# Patient Record
Sex: Male | Born: 1950 | Race: White | Hispanic: No | Marital: Single | Smoking: Never smoker
Health system: Southern US, Community
[De-identification: ages and names within clinical notes are randomized; demographics above are authoritative.]

## PROBLEM LIST (undated history)

## (undated) DIAGNOSIS — I10 Essential (primary) hypertension: Secondary | ICD-10-CM

## (undated) DIAGNOSIS — E785 Hyperlipidemia, unspecified: Secondary | ICD-10-CM

## (undated) DIAGNOSIS — M1990 Unspecified osteoarthritis, unspecified site: Secondary | ICD-10-CM

## (undated) DIAGNOSIS — I493 Ventricular premature depolarization: Secondary | ICD-10-CM

## (undated) HISTORY — PX: ORIF ELBOW FRACTURE: SUR928

---

## 1998-04-14 ENCOUNTER — Encounter: Payer: Self-pay | Admitting: Emergency Medicine

## 1998-04-14 ENCOUNTER — Emergency Department (HOSPITAL_COMMUNITY): Admission: EM | Admit: 1998-04-14 | Discharge: 1998-04-14 | Payer: Self-pay | Admitting: Emergency Medicine

## 2004-08-30 ENCOUNTER — Inpatient Hospital Stay (HOSPITAL_COMMUNITY): Admission: EM | Admit: 2004-08-30 | Discharge: 2004-09-01 | Payer: Self-pay | Admitting: Emergency Medicine

## 2004-09-04 ENCOUNTER — Inpatient Hospital Stay (HOSPITAL_COMMUNITY): Admission: AD | Admit: 2004-09-04 | Discharge: 2004-09-07 | Payer: Self-pay | Admitting: Orthopedic Surgery

## 2004-09-08 ENCOUNTER — Ambulatory Visit: Admission: RE | Admit: 2004-09-08 | Discharge: 2004-09-12 | Payer: Self-pay | Admitting: Radiation Oncology

## 2004-09-15 ENCOUNTER — Encounter: Admission: RE | Admit: 2004-09-15 | Discharge: 2004-12-14 | Payer: Self-pay | Admitting: Orthopedic Surgery

## 2004-10-14 ENCOUNTER — Ambulatory Visit: Admission: RE | Admit: 2004-10-14 | Discharge: 2004-10-14 | Payer: Self-pay | Admitting: Radiation Oncology

## 2004-12-15 ENCOUNTER — Encounter: Admission: RE | Admit: 2004-12-15 | Discharge: 2005-03-15 | Payer: Self-pay | Admitting: Orthopedic Surgery

## 2005-03-17 ENCOUNTER — Encounter: Admission: RE | Admit: 2005-03-17 | Discharge: 2005-06-15 | Payer: Self-pay | Admitting: Orthopedic Surgery

## 2005-06-16 ENCOUNTER — Encounter: Admission: RE | Admit: 2005-06-16 | Discharge: 2005-09-14 | Payer: Self-pay | Admitting: Orthopedic Surgery

## 2008-08-27 ENCOUNTER — Ambulatory Visit: Payer: Self-pay | Admitting: Gastroenterology

## 2016-02-05 ENCOUNTER — Encounter: Payer: Self-pay | Admitting: *Deleted

## 2016-02-06 ENCOUNTER — Encounter: Payer: Self-pay | Admitting: *Deleted

## 2016-02-06 ENCOUNTER — Ambulatory Visit
Admission: RE | Admit: 2016-02-06 | Discharge: 2016-02-06 | Disposition: A | Discharge status: Home / Self Care | Payer: Medicare Other | Source: Ambulatory Visit | Attending: Gastroenterology | Admitting: Gastroenterology

## 2016-02-06 ENCOUNTER — Encounter
Admission: RE | Disposition: A | Discharge status: Home / Self Care | Payer: Self-pay | Source: Ambulatory Visit | Attending: Gastroenterology

## 2016-02-06 ENCOUNTER — Ambulatory Visit: Payer: Medicare Other | Admitting: Anesthesiology

## 2016-02-06 DIAGNOSIS — Z79899 Other long term (current) drug therapy: Secondary | ICD-10-CM | POA: Diagnosis not present

## 2016-02-06 DIAGNOSIS — K621 Rectal polyp: Secondary | ICD-10-CM | POA: Diagnosis not present

## 2016-02-06 DIAGNOSIS — K640 First degree hemorrhoids: Secondary | ICD-10-CM | POA: Insufficient documentation

## 2016-02-06 DIAGNOSIS — D120 Benign neoplasm of cecum: Secondary | ICD-10-CM | POA: Diagnosis not present

## 2016-02-06 DIAGNOSIS — Z86010 Personal history of colonic polyps: Secondary | ICD-10-CM | POA: Diagnosis not present

## 2016-02-06 DIAGNOSIS — I10 Essential (primary) hypertension: Secondary | ICD-10-CM | POA: Diagnosis not present

## 2016-02-06 DIAGNOSIS — K5730 Diverticulosis of large intestine without perforation or abscess without bleeding: Secondary | ICD-10-CM | POA: Insufficient documentation

## 2016-02-06 DIAGNOSIS — E785 Hyperlipidemia, unspecified: Secondary | ICD-10-CM | POA: Diagnosis not present

## 2016-02-06 DIAGNOSIS — Z1211 Encounter for screening for malignant neoplasm of colon: Secondary | ICD-10-CM | POA: Insufficient documentation

## 2016-02-06 HISTORY — PX: COLONOSCOPY WITH PROPOFOL: SHX5780

## 2016-02-06 HISTORY — DX: Essential (primary) hypertension: I10

## 2016-02-06 HISTORY — DX: Hyperlipidemia, unspecified: E78.5

## 2016-02-06 SURGERY — COLONOSCOPY WITH PROPOFOL
Anesthesia: General

## 2016-02-06 MED ORDER — GLYCOPYRROLATE 0.2 MG/ML IJ SOLN
INTRAMUSCULAR | Status: DC | PRN
  Administered 2016-02-06: 0.2 mg via INTRAVENOUS

## 2016-02-06 MED ORDER — EPHEDRINE SULFATE 50 MG/ML IJ SOLN
INTRAMUSCULAR | Status: DC | PRN
  Administered 2016-02-06: 5 mg via INTRAVENOUS

## 2016-02-06 MED ORDER — PROPOFOL 500 MG/50ML IV EMUL
INTRAVENOUS | Status: DC | PRN
  Administered 2016-02-06: 100 ug/kg/min via INTRAVENOUS

## 2016-02-06 MED ORDER — SODIUM CHLORIDE 0.9 % IV SOLN
INTRAVENOUS | Status: DC
  Administered 2016-02-06: 08:00:00 via INTRAVENOUS

## 2016-02-06 MED ORDER — PHENYLEPHRINE HCL 10 MG/ML IJ SOLN
INTRAMUSCULAR | Status: DC | PRN
  Administered 2016-02-06: 100 ug via INTRAVENOUS

## 2016-02-06 MED ORDER — PROPOFOL 10 MG/ML IV BOLUS
INTRAVENOUS | Status: DC | PRN
  Administered 2016-02-06 (×2): 20 mg via INTRAVENOUS
  Administered 2016-02-06: 50 mg via INTRAVENOUS
  Administered 2016-02-06 (×2): 30 mg via INTRAVENOUS
  Administered 2016-02-06: 50 mg via INTRAVENOUS
  Administered 2016-02-06: 20 mg via INTRAVENOUS

## 2016-02-06 MED ORDER — LIDOCAINE HCL (CARDIAC) 10 MG/ML IV SOLN
INTRAVENOUS | Status: DC | PRN
  Administered 2016-02-06 (×2): 10 mg via INTRAVENOUS

## 2016-02-06 MED ORDER — SODIUM CHLORIDE 0.9 % IV SOLN
INTRAVENOUS | Status: DC
  Administered 2016-02-06: 07:00:00 via INTRAVENOUS

## 2016-02-06 NOTE — Op Note (Signed)
[facility] Gastroenterology Patient Name: Terry Suarez Procedure Date: 02/06/2016 7:46 AM MRN: [id number] Account #: [id number] Date of Birth: 18-Mar-1951 Admit Type: Outpatient Age: 65 Room: [facility] ENDO ROOM 4 Gender: Male Note Status: Finalized Procedure:            Colonoscopy Indications:          Personal history of colonic polyps Providers:            Lollie Sails, MD Referring MD:         Rusty Aus, MD (Referring MD) Medicines:            Monitored Anesthesia Care Complications:        No immediate complications. Procedure:            Pre-Anesthesia Assessment:                       - ASA Grade Assessment: II - A patient with mild                        systemic disease.                       After obtaining informed consent, the colonoscope was                        passed under direct vision. Throughout the procedure,                        the patient's blood pressure, pulse, and oxygen                        saturations were monitored continuously. The                        Colonoscope was introduced through the anus and                        advanced to the the cecum, identified by appendiceal                        orifice and ileocecal valve. The colonoscopy was                        performed without difficulty. The patient tolerated the                        procedure well. The quality of the bowel preparation                        was good. Findings:      A 2 mm polyp was found in the rectum. The polyp was sessile. The polyp       was removed with a cold biopsy forceps. Resection and retrieval were       complete.      A 2 mm polyp was found in the cecum. The polyp was sessile. The polyp       was removed with a cold biopsy forceps. Resection and retrieval were       complete.      Scattered small and large-mouthed diverticula were found in the sigmoid       colon,  descending colon and ascending colon.      The retroflexed  view of the distal rectum and anal verge was normal and       showed no anal or rectal abnormalities.      Non-bleeding internal hemorrhoids were found during anoscopy. The       hemorrhoids were small and Grade I (internal hemorrhoids that do not       prolapse).      The digital rectal exam was normal. Impression:           - One 2 mm polyp in the rectum, removed with a cold                        biopsy forceps. Resected and retrieved.                       - One 2 mm polyp in the cecum, removed with a cold                        biopsy forceps. Resected and retrieved.                       - Diverticulosis in the sigmoid colon, in the                        descending colon and in the ascending colon.                       - The distal rectum and anal verge are normal on                        retroflexion view.                       - Non-bleeding internal hemorrhoids. Recommendation:       - Discharge patient to home. Procedure Code(s):    --- Professional ---                       [phone], Colonoscopy, flexible; with biopsy, single or                        multiple Diagnosis Code(s):    --- Professional ---                       K62.1, Rectal polyp                       D12.0, Benign neoplasm of cecum                       K64.0, First degree hemorrhoids                       Z86.010, Personal history of colonic polyps                       K57.30, Diverticulosis of large intestine without                        perforation or abscess without bleeding CPT copyright 2016 [employer]. All rights reserved. The codes documented in this report are preliminary  and upon coder review may  be revised to meet current compliance requirements. Lollie Sails, MD 02/06/2016 8:20:15 AM This report has been signed electronically. Number of Addenda: 0 Note Initiated On: 02/06/2016 7:46 AM Scope Withdrawal Time: 0 hours 10 minutes 4 seconds  Total Procedure Duration: 0  hours 24 minutes 4 seconds       [facility]

## 2016-02-06 NOTE — Anesthesia Preprocedure Evaluation (Signed)
Anesthesia Evaluation  Patient identified by MRN, date of birth, ID band Patient awake    Reviewed: Allergy & Precautions, H&P , NPO status , Patient's Chart, lab work & pertinent test results, reviewed documented beta blocker date and time   Airway Mallampati: II   Neck ROM: full    Dental  (+) Poor Dentition, Teeth Intact   Pulmonary neg pulmonary ROS,    Pulmonary exam normal        Cardiovascular hypertension, negative cardio ROS Normal cardiovascular exam Rhythm:regular Rate:Normal     Neuro/Psych negative neurological ROS  negative psych ROS   GI/Hepatic negative GI ROS, Neg liver ROS,   Endo/Other  negative endocrine ROS  Renal/GU negative Renal ROS  negative genitourinary   Musculoskeletal   Abdominal   Peds  Hematology negative hematology ROS (+)   Anesthesia Other Findings Past Medical History: No date: Hyperlipidemia No date: Hypertension Past Surgical History: No date: ORIF ELBOW FRACTURE   Reproductive/Obstetrics                             Anesthesia Physical Anesthesia Plan  ASA: II  Anesthesia Plan: General   Post-op Pain Management:    Induction:   Airway Management Planned:   Additional Equipment:   Intra-op Plan:   Post-operative Plan:   Informed Consent: I have reviewed the patients History and Physical, chart, labs and discussed the procedure including the risks, benefits and alternatives for the proposed anesthesia with the patient or authorized representative who has indicated his/her understanding and acceptance.   Dental Advisory Given  Plan Discussed with: CRNA  Anesthesia Plan Comments:         Anesthesia Quick Evaluation

## 2016-02-06 NOTE — Transfer of Care (Signed)
Immediate Anesthesia Transfer of Care Note  Patient: Terry Suarez.  Procedure(s) Performed: Procedure(s): COLONOSCOPY WITH PROPOFOL (N/A)  Patient Location: PACU  Anesthesia Type:General  Level of Consciousness: awake, alert  and oriented  Airway & Oxygen Therapy: Patient Spontanous Breathing and Patient connected to nasal cannula oxygen  Post-op Assessment: Report given to RN, Post -op Vital signs reviewed and stable and Patient moving all extremities X 4  Post vital signs: Reviewed and stable  Last Vitals:  Vitals:   02/06/16 0707 02/06/16 0820  BP: 134/74 110/70  Pulse: 69 88  Resp: 14 18  Temp: 36.3 C 36.3 C    Last Pain:  Vitals:   02/06/16 0707  TempSrc: Tympanic         Complications: No apparent anesthesia complications

## 2016-02-06 NOTE — H&P (Signed)
Outpatient short stay form Pre-procedure 02/06/2016 7:43 AM Terry Sails MD  Primary Physician: Dr. Emily Filbert  Reason for visit:  Colonoscopy  History of present illness:  Patient is a 65 year old male presenting today as above. He has a personal history of adenomatous colon polyps. He tolerated his prep well. He takes no aspirin products or blood thinning agents.    Current Facility-Administered Medications:  .  0.9 %  sodium chloride infusion, , Intravenous, Continuous, Terry Sails, MD, Last Rate: 20 mL/hr at 02/06/16 0728 .  0.9 %  sodium chloride infusion, , Intravenous, Continuous, Terry Sails, MD  Prescriptions Prior to Admission  Medication Sig Dispense Refill Last Dose  . atorvastatin (LIPITOR) 10 MG tablet Take 10 mg by mouth daily.   02/06/2016 at 0500  . losartan-hydrochlorothiazide (HYZAAR) 100-25 MG tablet Take 1 tablet by mouth daily.   02/06/2016 at 0500  . naproxen sodium (ANAPROX) 220 MG tablet Take 220 mg by mouth every morning.   Past Week at Unknown time     No Known Allergies   Past Medical History:  Diagnosis Date  . Hyperlipidemia   . Hypertension     Review of systems:      Physical Exam    Heart and lungs: Regular rate and rhythm without rub or gallop, lungs are bilaterally clear.    HEENT: Normocephalic atraumatic eyes are anicteric    Other:     Pertinant exam for procedure: Soft nontender nondistended bowel sounds positive normoactive.    Planned proceedures: Colonoscopy and indicated procedures. I have discussed the risks benefits and complications of procedures to include not limited to bleeding, infection, perforation and the risk of sedation and the patient wishes to proceed.    Terry Sails, MD Gastroenterology 02/06/2016  7:43 AM

## 2016-02-07 LAB — SURGICAL PATHOLOGY

## 2016-02-10 NOTE — Anesthesia Postprocedure Evaluation (Signed)
Anesthesia Post Note  Patient: Terry Suarez.  Procedure(s) Performed: Procedure(s) (LRB): COLONOSCOPY WITH PROPOFOL (N/A)  Patient location during evaluation: PACU Anesthesia Type: General Level of consciousness: awake and alert Pain management: pain level controlled Vital Signs Assessment: post-procedure vital signs reviewed and stable Respiratory status: spontaneous breathing, nonlabored ventilation, respiratory function stable and patient connected to nasal cannula oxygen Cardiovascular status: blood pressure returned to baseline and stable Postop Assessment: no signs of nausea or vomiting Anesthetic complications: no    Last Vitals:  Vitals:   02/06/16 0707 02/06/16 0820  BP: 134/74 110/70  Pulse: 69 88  Resp: 14 18  Temp: 36.3 C 36.3 C    Last Pain:  Vitals:   02/06/16 0707  TempSrc: Tympanic                 Molli Barrows

## 2018-09-12 ENCOUNTER — Other Ambulatory Visit: Payer: Self-pay

## 2018-09-12 ENCOUNTER — Encounter
Admission: RE | Admit: 2018-09-12 | Discharge: 2018-09-12 | Disposition: A | Discharge status: Home / Self Care | Payer: Medicare Other | Source: Ambulatory Visit | Attending: Surgery | Admitting: Surgery

## 2018-09-12 DIAGNOSIS — Z1159 Encounter for screening for other viral diseases: Secondary | ICD-10-CM | POA: Insufficient documentation

## 2018-09-12 DIAGNOSIS — Z01818 Encounter for other preprocedural examination: Secondary | ICD-10-CM | POA: Insufficient documentation

## 2018-09-12 HISTORY — DX: Unspecified osteoarthritis, unspecified site: M19.90

## 2018-09-12 LAB — URINALYSIS, ROUTINE W REFLEX MICROSCOPIC
Bilirubin Urine: NEGATIVE
Glucose, UA: NEGATIVE mg/dL
Hgb urine dipstick: NEGATIVE
Ketones, ur: NEGATIVE mg/dL
Leukocytes,Ua: NEGATIVE
Nitrite: NEGATIVE
Protein, ur: NEGATIVE mg/dL
Specific Gravity, Urine: 1.013 (ref 1.005–1.030)
pH: 5 (ref 5.0–8.0)

## 2018-09-12 LAB — BASIC METABOLIC PANEL
Anion gap: 8 (ref 5–15)
BUN: 19 mg/dL (ref 8–23)
CO2: 29 mmol/L (ref 22–32)
Calcium: 9.4 mg/dL (ref 8.9–10.3)
Chloride: 101 mmol/L (ref 98–111)
Creatinine, Ser: 0.93 mg/dL (ref 0.61–1.24)
GFR calc Af Amer: >60 mL/min (ref >60)
GFR calc non Af Amer: >60 mL/min (ref >60)
Glucose, Bld: 95 mg/dL (ref 70–99)
Potassium: 3.8 mmol/L (ref 3.5–5.1)
Sodium: 138 mmol/L (ref 135–145)

## 2018-09-12 LAB — CBC
HCT: 45.2 % (ref 39.0–52.0)
Hemoglobin: 15.2 g/dL (ref 13.0–17.0)
MCH: 30.8 pg (ref 26.0–34.0)
MCHC: 33.6 g/dL (ref 30.0–36.0)
MCV: 91.5 fL (ref 80.0–100.0)
Platelets: 227 10*3/uL (ref 150–400)
RBC: 4.94 MIL/uL (ref 4.22–5.81)
RDW: 12.6 % (ref 11.5–15.5)
WBC: 5.3 10*3/uL (ref 4.0–10.5)
nRBC: 0 % (ref 0.0–0.2)

## 2018-09-12 LAB — TYPE AND SCREEN
ABO/RH(D): O POS
Antibody Screen: NEGATIVE

## 2018-09-12 LAB — SURGICAL PCR SCREEN
MRSA, PCR: NEGATIVE
Staphylococcus aureus: NEGATIVE

## 2018-09-12 NOTE — Patient Instructions (Signed)
Your procedure is scheduled on: 09-15-18 THURSDAY Report to Same Day Surgery 2nd floor medical mall [facility] Entrance-take elevator on left to 2nd floor.  Check in with surgery information desk.) To find out your arrival time please call [phone] between 1PM - 3PM on 09-14-18 [facility]  Remember: Instructions that are not followed completely may result in serious medical risk, up to and including death, or upon the discretion of your surgeon and anesthesiologist your surgery may need to be rescheduled.    _x___ 1. Do not eat food after midnight the night before your procedure. NO GUM OR CANDY AFTER MIDNIGHT.  You may drink clear liquids up to 2 hours before you are scheduled to arrive at the hospital for your procedure.  Do not drink clear liquids within 2 hours of your scheduled arrival to the hospital.  Clear liquids include  --Water or Apple juice without pulp  --Clear carbohydrate beverage such as ClearFast or Gatorade  --Black Coffee or Clear Tea (No milk, no creamers, do not add anything to the coffee or Tea   ____Ensure clear carbohydrate drink on the way to the hospital for bariatric patients  ____Ensure clear carbohydrate drink 3 hours before surgery for Dr Dwyane Luo patients if physician instructed.    __x__ 2. No Alcohol for 24 hours before or after surgery.   __x__3. No Smoking or e-cigarettes for 24 prior to surgery.  Do not use any chewable tobacco products for at least 6 hour prior to surgery   ____  4. Bring all medications with you on the day of surgery if instructed.    __x__ 5. Notify your doctor if there is any change in your medical condition     (cold, fever, infections).    x___6. On the morning of surgery brush your teeth with toothpaste and water.  You may rinse your mouth with mouth wash if you wish.  Do not swallow any toothpaste or mouthwash.   Do not wear jewelry, make-up, hairpins, clips or nail polish.  Do not wear lotions, powders, or perfumes.  You may wear deodorant.  Do not shave 48 hours prior to surgery. Men may shave face and neck.  Do not bring valuables to the hospital.    [facility] is not responsible for any belongings or valuables.               Contacts, dentures or bridgework may not be worn into surgery.  Leave your suitcase in the car. After surgery it may be brought to your room.  For patients admitted to the hospital, discharge time is determined by your treatment team.  _  Patients discharged the day of surgery will not be allowed to drive home.  You will need someone to drive you home and stay with you the night of your procedure.    Please read over the following fact sheets that you were given:   [facility] Preparing for Surgery and or MRSA Information   _x___ TAKE THE FOLLOWING MEDICATION THE MORNING OF SURGERY WITH A SMALL SIP OF WATER. These include:  1. LIPITOR (ATORVASTATIN)  2.  3.  4.  5.  6.  ____Fleets enema or Magnesium Citrate as directed.   _x___ Use CHG Soap or sage wipes as directed on instruction sheet   ____ Use inhalers on the day of surgery and bring to hospital day of surgery  ____ Stop Metformin and Janumet 2 days prior to surgery.    ____ Take 1/2 of usual insulin  dose the night before surgery and none on the morning surgery.   ____ Follow recommendations from Cardiologist, Pulmonologist or PCP regarding stopping Aspirin, Coumadin, Plavix ,Eliquis, Effient, or Pradaxa, and Pletal.  X____Stop Anti-inflammatories such as Advil, Aleve, Ibuprofen, Motrin, Naproxen, Naprosyn, Goodies powders or aspirin products NOW-OK to take Tylenol    ____ Stop supplements until after surgery.     ____ Bring C-Pap to the hospital.

## 2018-09-13 LAB — URINE CULTURE: Culture: 20000 — AB

## 2018-09-13 LAB — NOVEL CORONAVIRUS, NAA (HOSP ORDER, SEND-OUT TO REF LAB; TAT 18-24 HRS): SARS-CoV-2, NAA: NOT DETECTED

## 2018-09-14 ENCOUNTER — Encounter: Payer: Self-pay | Admitting: *Deleted

## 2018-09-14 MED ORDER — CEFAZOLIN SODIUM-DEXTROSE 2-4 GM/100ML-% IV SOLN
2.0000 g | Freq: Once | INTRAVENOUS | Status: AC
  Administered 2018-09-15: 2 g via INTRAVENOUS

## 2018-09-15 ENCOUNTER — Inpatient Hospital Stay: Payer: Medicare Other | Admitting: Certified Registered"

## 2018-09-15 ENCOUNTER — Other Ambulatory Visit: Payer: Self-pay

## 2018-09-15 ENCOUNTER — Encounter
Admission: RE | Disposition: A | Discharge status: Home Health | Payer: Self-pay | Source: Home / Self Care | Attending: Surgery

## 2018-09-15 ENCOUNTER — Encounter: Payer: Self-pay | Admitting: *Deleted

## 2018-09-15 ENCOUNTER — Inpatient Hospital Stay: Payer: Medicare Other

## 2018-09-15 ENCOUNTER — Inpatient Hospital Stay
Admission: RE | Admit: 2018-09-15 | Discharge: 2018-09-17 | DRG: 470 | Disposition: A | Discharge status: Home Health | Payer: Medicare Other | Attending: Surgery | Admitting: Surgery

## 2018-09-15 DIAGNOSIS — Z1159 Encounter for screening for other viral diseases: Secondary | ICD-10-CM

## 2018-09-15 DIAGNOSIS — I959 Hypotension, unspecified: Secondary | ICD-10-CM | POA: Diagnosis not present

## 2018-09-15 DIAGNOSIS — I493 Ventricular premature depolarization: Secondary | ICD-10-CM | POA: Diagnosis present

## 2018-09-15 DIAGNOSIS — E785 Hyperlipidemia, unspecified: Secondary | ICD-10-CM | POA: Diagnosis present

## 2018-09-15 DIAGNOSIS — I10 Essential (primary) hypertension: Secondary | ICD-10-CM | POA: Diagnosis present

## 2018-09-15 DIAGNOSIS — Z96641 Presence of right artificial hip joint: Secondary | ICD-10-CM

## 2018-09-15 DIAGNOSIS — M1611 Unilateral primary osteoarthritis, right hip: Secondary | ICD-10-CM | POA: Diagnosis present

## 2018-09-15 DIAGNOSIS — M25551 Pain in right hip: Secondary | ICD-10-CM | POA: Diagnosis present

## 2018-09-15 HISTORY — DX: Ventricular premature depolarization: I49.3

## 2018-09-15 HISTORY — PX: TOTAL HIP ARTHROPLASTY: SHX124

## 2018-09-15 LAB — GLUCOSE, CAPILLARY: Glucose-Capillary: 97 mg/dL (ref 70–99)

## 2018-09-15 LAB — ABO/RH: ABO/RH(D): O POS

## 2018-09-15 SURGERY — ARTHROPLASTY, HIP, TOTAL,POSTERIOR APPROACH
Anesthesia: General | Laterality: Right

## 2018-09-15 MED ORDER — ACETAMINOPHEN 325 MG PO TABS
325.0000 mg | ORAL_TABLET | Freq: Four times a day (QID) | ORAL | Status: DC | PRN
  Administered 2018-09-16: 21:00:00 650 mg via ORAL
  Filled 2018-09-15 (×2): qty 2

## 2018-09-15 MED ORDER — PROPOFOL 500 MG/50ML IV EMUL
INTRAVENOUS | Status: DC | PRN
  Administered 2018-09-15: 75 ug/kg/min via INTRAVENOUS

## 2018-09-15 MED ORDER — FLEET ENEMA 7-19 GM/118ML RE ENEM: 1.0000 | ENEMA | Freq: Once | RECTAL | Status: DC | PRN

## 2018-09-15 MED ORDER — ACETAMINOPHEN 10 MG/ML IV SOLN
INTRAVENOUS | Status: AC
  Filled 2018-09-15: qty 100

## 2018-09-15 MED ORDER — FAMOTIDINE 20 MG PO TABS
20.0000 mg | ORAL_TABLET | Freq: Once | ORAL | Status: AC
  Administered 2018-09-15: 10:00:00 20 mg via ORAL

## 2018-09-15 MED ORDER — FENTANYL CITRATE (PF) 100 MCG/2ML IJ SOLN: 25.0000 ug | INTRAMUSCULAR | Status: DC | PRN

## 2018-09-15 MED ORDER — BUPIVACAINE HCL (PF) 0.5 % IJ SOLN
INTRAMUSCULAR | Status: AC
  Filled 2018-09-15: qty 10

## 2018-09-15 MED ORDER — KETOROLAC TROMETHAMINE 30 MG/ML IJ SOLN
INTRAMUSCULAR | Status: AC
  Administered 2018-09-15: 13:00:00 30 mg via INTRAVENOUS
  Filled 2018-09-15: qty 1

## 2018-09-15 MED ORDER — SODIUM CHLORIDE 0.9 % IV SOLN
INTRAVENOUS | Status: DC | PRN
  Administered 2018-09-15: 12:00:00 30 ug/min via INTRAVENOUS

## 2018-09-15 MED ORDER — FENTANYL CITRATE (PF) 100 MCG/2ML IJ SOLN
INTRAMUSCULAR | Status: AC
  Filled 2018-09-15: qty 2

## 2018-09-15 MED ORDER — MAGNESIUM HYDROXIDE 400 MG/5ML PO SUSP
30.0000 mL | Freq: Every day | ORAL | Status: DC | PRN
  Administered 2018-09-16: 21:00:00 30 mL via ORAL
  Filled 2018-09-15 (×2): qty 30

## 2018-09-15 MED ORDER — KETOROLAC TROMETHAMINE 15 MG/ML IJ SOLN
15.0000 mg | Freq: Four times a day (QID) | INTRAMUSCULAR | Status: AC
  Administered 2018-09-15 – 2018-09-16 (×4): 15 mg via INTRAVENOUS
  Filled 2018-09-15 (×4): qty 1

## 2018-09-15 MED ORDER — ONDANSETRON HCL 4 MG/2ML IJ SOLN: 4.0000 mg | Freq: Four times a day (QID) | INTRAMUSCULAR | Status: DC | PRN

## 2018-09-15 MED ORDER — DIPHENHYDRAMINE HCL 12.5 MG/5ML PO ELIX
12.5000 mg | ORAL_SOLUTION | ORAL | Status: DC | PRN
  Filled 2018-09-15: qty 10

## 2018-09-15 MED ORDER — BUPIVACAINE-EPINEPHRINE (PF) 0.5% -1:200000 IJ SOLN
INTRAMUSCULAR | Status: DC | PRN
  Administered 2018-09-15: 30 mL

## 2018-09-15 MED ORDER — LACTATED RINGERS IV SOLN
INTRAVENOUS | Status: DC
  Administered 2018-09-15 (×2): via INTRAVENOUS

## 2018-09-15 MED ORDER — TRAMADOL HCL 50 MG PO TABS
50.0000 mg | ORAL_TABLET | Freq: Four times a day (QID) | ORAL | Status: DC | PRN
  Administered 2018-09-16 – 2018-09-17 (×3): 50 mg via ORAL
  Filled 2018-09-15 (×3): qty 1

## 2018-09-15 MED ORDER — PROPOFOL 10 MG/ML IV BOLUS
INTRAVENOUS | Status: AC
  Filled 2018-09-15: qty 40

## 2018-09-15 MED ORDER — ACETAMINOPHEN 500 MG PO TABS
1000.0000 mg | ORAL_TABLET | Freq: Four times a day (QID) | ORAL | Status: AC
  Administered 2018-09-15 – 2018-09-16 (×4): 1000 mg via ORAL
  Filled 2018-09-15 (×4): qty 2

## 2018-09-15 MED ORDER — ATORVASTATIN CALCIUM 10 MG PO TABS
10.0000 mg | ORAL_TABLET | Freq: Every morning | ORAL | Status: DC
  Administered 2018-09-16 – 2018-09-17 (×2): 10 mg via ORAL
  Filled 2018-09-15 (×3): qty 1

## 2018-09-15 MED ORDER — PANTOPRAZOLE SODIUM 40 MG PO TBEC
40.0000 mg | DELAYED_RELEASE_TABLET | Freq: Every day | ORAL | Status: DC
  Administered 2018-09-16 – 2018-09-17 (×2): 40 mg via ORAL
  Filled 2018-09-15 (×2): qty 1

## 2018-09-15 MED ORDER — OXYCODONE HCL 5 MG PO TABS
5.0000 mg | ORAL_TABLET | ORAL | Status: DC | PRN
  Administered 2018-09-15: 5 mg via ORAL
  Filled 2018-09-15: qty 1

## 2018-09-15 MED ORDER — SODIUM CHLORIDE 0.9 % IV BOLUS
500.0000 mL | Freq: Once | INTRAVENOUS | Status: AC
  Administered 2018-09-15: 500 mL via INTRAVENOUS

## 2018-09-15 MED ORDER — DOCUSATE SODIUM 100 MG PO CAPS
100.0000 mg | ORAL_CAPSULE | Freq: Two times a day (BID) | ORAL | Status: DC
  Administered 2018-09-15 – 2018-09-17 (×4): 100 mg via ORAL
  Filled 2018-09-15 (×4): qty 1

## 2018-09-15 MED ORDER — HYDROCHLOROTHIAZIDE 25 MG PO TABS
25.0000 mg | ORAL_TABLET | Freq: Every morning | ORAL | Status: DC
  Filled 2018-09-15 (×2): qty 1

## 2018-09-15 MED ORDER — KETOROLAC TROMETHAMINE 30 MG/ML IJ SOLN
30.0000 mg | Freq: Once | INTRAMUSCULAR | Status: AC
  Administered 2018-09-15: 13:00:00 30 mg via INTRAVENOUS

## 2018-09-15 MED ORDER — LOSARTAN POTASSIUM 50 MG PO TABS
100.0000 mg | ORAL_TABLET | Freq: Every morning | ORAL | Status: DC
  Filled 2018-09-15 (×2): qty 2

## 2018-09-15 MED ORDER — ENOXAPARIN SODIUM 40 MG/0.4ML ~~LOC~~ SOLN
40.0000 mg | SUBCUTANEOUS | Status: DC
  Administered 2018-09-16 – 2018-09-17 (×2): 40 mg via SUBCUTANEOUS
  Filled 2018-09-15 (×2): qty 0.4

## 2018-09-15 MED ORDER — NEOMYCIN-POLYMYXIN B GU 40-200000 IR SOLN
Status: DC | PRN
  Administered 2018-09-15: 14 mL

## 2018-09-15 MED ORDER — PHENYLEPHRINE HCL (PRESSORS) 10 MG/ML IV SOLN
INTRAVENOUS | Status: DC | PRN
  Administered 2018-09-15 (×5): 100 ug via INTRAVENOUS

## 2018-09-15 MED ORDER — HYDROMORPHONE HCL 1 MG/ML IJ SOLN: 0.2500 mg | INTRAMUSCULAR | Status: DC | PRN

## 2018-09-15 MED ORDER — FAMOTIDINE 20 MG PO TABS
ORAL_TABLET | ORAL | Status: AC
  Filled 2018-09-15: qty 1

## 2018-09-15 MED ORDER — EPHEDRINE SULFATE 50 MG/ML IJ SOLN
INTRAMUSCULAR | Status: DC | PRN
  Administered 2018-09-15: 5 mg via INTRAVENOUS
  Administered 2018-09-15 (×2): 7.5 mg via INTRAVENOUS
  Administered 2018-09-15: 5 mg via INTRAVENOUS

## 2018-09-15 MED ORDER — ONDANSETRON HCL 4 MG PO TABS: 4.0000 mg | ORAL_TABLET | Freq: Four times a day (QID) | ORAL | Status: DC | PRN

## 2018-09-15 MED ORDER — BISACODYL 10 MG RE SUPP
10.0000 mg | Freq: Every day | RECTAL | Status: DC | PRN
  Administered 2018-09-17: 09:00:00 10 mg via RECTAL
  Filled 2018-09-15: qty 1

## 2018-09-15 MED ORDER — METOCLOPRAMIDE HCL 5 MG PO TABS: 5.0000 mg | ORAL_TABLET | Freq: Three times a day (TID) | ORAL | Status: DC | PRN

## 2018-09-15 MED ORDER — ONDANSETRON HCL 4 MG/2ML IJ SOLN: 4.0000 mg | Freq: Once | INTRAMUSCULAR | Status: DC | PRN

## 2018-09-15 MED ORDER — SODIUM CHLORIDE 0.9 % IV SOLN
INTRAVENOUS | Status: DC
  Administered 2018-09-15 (×2): via INTRAVENOUS

## 2018-09-15 MED ORDER — MIDAZOLAM HCL 5 MG/5ML IJ SOLN
INTRAMUSCULAR | Status: DC | PRN
  Administered 2018-09-15 (×2): 1 mg via INTRAVENOUS

## 2018-09-15 MED ORDER — FENTANYL CITRATE (PF) 100 MCG/2ML IJ SOLN
INTRAMUSCULAR | Status: DC | PRN
  Administered 2018-09-15 (×2): 50 ug via INTRAVENOUS

## 2018-09-15 MED ORDER — METOCLOPRAMIDE HCL 5 MG/ML IJ SOLN: 5.0000 mg | Freq: Three times a day (TID) | INTRAMUSCULAR | Status: DC | PRN

## 2018-09-15 MED ORDER — SODIUM CHLORIDE 0.9 % IV SOLN
INTRAVENOUS | Status: DC | PRN
  Administered 2018-09-15: 60 mL

## 2018-09-15 MED ORDER — MIDAZOLAM HCL 2 MG/2ML IJ SOLN
INTRAMUSCULAR | Status: AC
  Filled 2018-09-15: qty 2

## 2018-09-15 MED ORDER — TRANEXAMIC ACID 1000 MG/10ML IV SOLN
INTRAVENOUS | Status: DC | PRN
  Administered 2018-09-15: 13:00:00 1000 mg via TOPICAL

## 2018-09-15 MED ORDER — CEFAZOLIN SODIUM-DEXTROSE 2-4 GM/100ML-% IV SOLN
2.0000 g | Freq: Four times a day (QID) | INTRAVENOUS | Status: AC
  Administered 2018-09-15 – 2018-09-16 (×3): 2 g via INTRAVENOUS
  Filled 2018-09-15 (×3): qty 100

## 2018-09-15 SURGICAL SUPPLY — 61 items
APL PRP STRL LF DISP 70% ISPRP (MISCELLANEOUS) ×1
BLADE SAGITTAL WIDE XTHICK NO (BLADE) ×3 IMPLANT
BLADE SURG SZ20 CARB STEEL (BLADE) ×3 IMPLANT
CANISTER SUCT 1200ML W/VALVE (MISCELLANEOUS) ×3 IMPLANT
CANISTER SUCT 3000ML PPV (MISCELLANEOUS) ×6 IMPLANT
CHLORAPREP W/TINT 26 (MISCELLANEOUS) ×3 IMPLANT
COVER WAND RF STERILE (DRAPES) ×3 IMPLANT
DRAPE IMP U-DRAPE 54X76 (DRAPES) ×3 IMPLANT
DRAPE INCISE IOBAN 66X60 STRL (DRAPES) ×3 IMPLANT
DRAPE SHEET LG 3/4 BI-LAMINATE (DRAPES) ×3 IMPLANT
DRAPE SURG 17X11 SM STRL (DRAPES) ×6 IMPLANT
DRSG OPSITE POSTOP 4X10 (GAUZE/BANDAGES/DRESSINGS) ×3 IMPLANT
DRSG TEGADERM 2-3/8X2-3/4 SM (GAUZE/BANDAGES/DRESSINGS) ×2 IMPLANT
ELECT BLADE 6.5 EXT (BLADE) ×3 IMPLANT
ELECT CAUTERY BLADE 6.4 (BLADE) ×3 IMPLANT
GLOVE BIO SURGEON STRL SZ7.5 (GLOVE) ×12 IMPLANT
GLOVE BIO SURGEON STRL SZ8 (GLOVE) ×12 IMPLANT
GLOVE BIOGEL PI IND STRL 8 (GLOVE) ×1 IMPLANT
GLOVE BIOGEL PI INDICATOR 8 (GLOVE) ×2
GLOVE INDICATOR 8.0 STRL GRN (GLOVE) ×3 IMPLANT
GOWN STRL REUS W/ TWL LRG LVL3 (GOWN DISPOSABLE) ×1 IMPLANT
GOWN STRL REUS W/ TWL XL LVL3 (GOWN DISPOSABLE) ×1 IMPLANT
GOWN STRL REUS W/TWL LRG LVL3 (GOWN DISPOSABLE) ×3
GOWN STRL REUS W/TWL XL LVL3 (GOWN DISPOSABLE) ×3
HEAD CERAMIC BIOLOX 36 T1 +3 (Head) ×2 IMPLANT
HOOD PEEL AWAY FLYTE STAYCOOL (MISCELLANEOUS) ×9 IMPLANT
KIT TURNOVER KIT A (KITS) ×3 IMPLANT
LINER ACE G7 36 SZ G HIGH WALL (Liner) ×2 IMPLANT
MAT ABSORB  FLUID 56X50 GRAY (MISCELLANEOUS) ×2
MAT ABSORB FLUID 56X50 GRAY (MISCELLANEOUS) ×1 IMPLANT
NDL FILTER BLUNT 18X1 1/2 (NEEDLE) ×1 IMPLANT
NDL SAFETY ECLIPSE 18X1.5 (NEEDLE) ×2 IMPLANT
NDL SPNL 20GX3.5 QUINCKE YW (NEEDLE) ×1 IMPLANT
NEEDLE FILTER BLUNT 18X 1/2SAF (NEEDLE) ×2
NEEDLE FILTER BLUNT 18X1 1/2 (NEEDLE) ×1 IMPLANT
NEEDLE HYPO 18GX1.5 SHARP (NEEDLE) ×6
NEEDLE SPNL 20GX3.5 QUINCKE YW (NEEDLE) ×3 IMPLANT
PACK HIP PROSTHESIS (MISCELLANEOUS) ×3 IMPLANT
PILLOW ABDUCTION FOAM SM (MISCELLANEOUS) ×3 IMPLANT
PIN STEINMAN 3/16 (PIN) ×3 IMPLANT
PIN STEINMANN 3/16X9 BAY 6PK (Pin) ×1 IMPLANT
PULSAVAC PLUS IRRIG FAN TIP (DISPOSABLE) ×3
SHELL ACETABULAR 3H 58MM G7 (Hips) ×2 IMPLANT
SOL .9 NS 3000ML IRR  AL (IV SOLUTION) ×2
SOL .9 NS 3000ML IRR AL (IV SOLUTION) ×1
SOL .9 NS 3000ML IRR UROMATIC (IV SOLUTION) ×1 IMPLANT
SPONGE LAP 18X18 RF (DISPOSABLE) ×3 IMPLANT
ST PIN 3/16X9 BAY 6PK (Pin) ×3 IMPLANT
STAPLER SKIN PROX 35W (STAPLE) ×3 IMPLANT
STEM COLLARLESS FULL 13X145MM (Stem) ×2 IMPLANT
SUT TICRON 2-0 30IN 311381 (SUTURE) ×9 IMPLANT
SUT VIC AB 0 CT1 36 (SUTURE) ×3 IMPLANT
SUT VIC AB 1 CT1 36 (SUTURE) ×6 IMPLANT
SUT VIC AB 2-0 CT1 (SUTURE) ×14 IMPLANT
SYR 10ML LL (SYRINGE) ×3 IMPLANT
SYR 20CC LL (SYRINGE) ×3 IMPLANT
SYR 30ML LL (SYRINGE) ×9 IMPLANT
TAPE TRANSPORE STRL 2 31045 (GAUZE/BANDAGES/DRESSINGS) ×3 IMPLANT
TIP FAN IRRIG PULSAVAC PLUS (DISPOSABLE) ×1 IMPLANT
TRAY FOLEY MTR SLVR 16FR STAT (SET/KITS/TRAYS/PACK) ×3 IMPLANT
WATER STERILE IRR 1000ML POUR (IV SOLUTION) ×3 IMPLANT

## 2018-09-15 NOTE — Anesthesia Procedure Notes (Signed)
Spinal  Patient location during procedure: OR Start time: 09/15/2018 10:44 AM Staffing Resident/CRNA: Zakariah Urwin, Einar Grad, CRNA Preanesthetic Checklist Completed: patient identified, site marked, surgical consent, pre-op evaluation, timeout performed, IV checked, risks and benefits discussed and monitors and equipment checked Spinal Block Patient position: sitting Prep: DuraPrep Patient monitoring: heart rate, cardiac monitor, continuous pulse ox and blood pressure Approach: midline Location: L3-4 Injection technique: single-shot Needle Needle type: Sprotte  Needle gauge: 24 G Needle length: 9 cm Assessment Sensory level: T4

## 2018-09-15 NOTE — Evaluation (Signed)
Physical Therapy Evaluation Patient Details Name: Terry Suarez. MRN: [id number] DOB: 05/13/1950 Today's Date: 09/15/2018   History of Present Illness  [name] Clifton Safley. is a 68 y.o. male who underwent R THA with posterior approach on 09/15/2018 with no noted complications and was admitted to the hospital for recovery. PMH includes arthritis, hyperlipidemia, hypertension, PVCs, ORIF for elbow fracture.     Clinical Impression  Patient alert and oriented x 4 at start of visit and able to provide a detailed history. Nursing in room obtaining vitals at start of visit: HR 66 bpm, BP 125/75 mmHg, and O2 sat 100% on RA while reclining in bed. Patient reports no pain and full feeling in bilateral LEs.  He states he was fully I with ambulation and all aspects of care prior to hospitalization. He plans to stay with his daughter who is currently working form home in [place] while rehabilitating from his surgery. He reports she has 3 steps to enter with right handrail, a one story home home with a walk-in shower. He has a SPC and BSC but no RW at home. His bed will be higher than he is used to and he barely must sit down to it. Upon physical therapy evaluation, patient required min A for supine to sit bed mobility to assist with moving R leg and cues for techniques to maintain hip precautions. He performed sit to stand transfer with RW and CGA with cues for proper hand placement and body/AD positioning. Patient ambulated approximately 200 feet with RW and CGA for safety. Patient started with heavy use of BUE through walker to minimize weight bearing on R LE and lacked heel strike on R foot with tendancy for R hip towards IR. With cuing, patient improved R heel strike and step length and was able to maintain neutral hip rotation. Patient demo stooped posture as he fatigued despite cuing to stand up taller. About 50 feet from room, pt reported slight dizziness but aggreed he could get to room and stated dizziness  was not worsening when asked several times as he made his way back. He did increase gait speed at that time to get to the room sooner. He seemed comfortable in chair following ambulation and requested water, which he took a few sips of. Stated dizziness was improving. Hand held pulse ox having difficulty reading so obtained dynamat. Hand held pulse ox reading upon return a few seconds later showed HR fluxuating between 39-41 bpm. O2 sat 95% on RA. Patient then started to get drowsy and lost consciousness within a few seconds. Nursing called by PT. Patient woke up and vomited a small amount of water as nursing arrived. Patient became alert very quickly. Vitals obtained at that time: HR 54 bpm, BP 90/46 mmHg. Patient noted to be diaphoretic and stated he felt hot. Nursing continued to attend to patient and he reported he felt recovered and remembered passing out. Nursing contacted surgeon and started bolus of fluid. Patient stated he felt fine now and wanted to stay up in bed. He was made comfortable and needs were place within reach. Nursing continuing to attend to patient to appeared to be comfortable and alert when PT left the room. Patient has experienced a decrease in functional independence and mobility and would benefit from continued physical therapy to address impairments and functional limitations to work towards return to PLOF or maximal functional independence.       Follow Up Recommendations Home health PT;Supervision - Intermittent;Supervision for mobility/OOB  Equipment Recommendations  Rolling walker with 5" wheels    Recommendations for Other Services OT consult     Precautions / Restrictions Precautions Precautions: Posterior Hip Precaution Booklet Issued: No Precaution Comments: No R hip adduction, flexion past 90, or internal rotation Restrictions Weight Bearing Restrictions: Yes RLE Weight Bearing: Weight bearing as tolerated      Mobility  Bed Mobility Overal bed  mobility: Needs Assistance Bed Mobility: Supine to Sit     Supine to sit: Min assist;HOB elevated     General bed mobility comments: Patient required min A and cuing to scoot legs to edge of bed while maintaing posterior hip precautions.   Transfers Overall transfer level: Needs assistance Equipment used: Rolling walker (2 wheeled) Transfers: Sit to/from Stand Sit to Stand: Min guard         General transfer comment: Pt required cuing for hand placement and education to maintain precautions. Required cuing for proper body and AD positioning prior to sitting.   Ambulation/Gait Ambulation/Gait assistance: Min guard Gait Distance (Feet): 200 Feet Assistive device: Rolling walker (2 wheeled) Gait Pattern/deviations: Step-through pattern;Decreased step length - left;Decreased stance time - right;Decreased stride length;Decreased weight shift to right;Trunk flexed Gait velocity: decreased   General Gait Details: Patient ambulated approximately 200 feet with RW and CGA for safety. Patient started with heavy use of BUE through walker to minimize weight bearing on R LE and lacked heel strike on R foot with tendancy for R hip towards IR. With cuing, patient improved R heel strike and step length and was able to maintain neutral hip rotation. Patient demo stooped posture as he fatigued despite cuing to stand up taller. About 50 feet from room, pt reported slight dizziness but aggreed he could get to room and stated dizziness was not worsening several times on the way. He did increase gait speed at that time.  He seemed comfortable in chair following ambulation and requested water, which he took a few sips of. Stated dizziness was improving. Hand held pulse ox having difficulty reading so obtained dynamat. Hand held pulse ox reading upon return a few seconds later showed HR fluxuating between 39-41 bpm. O2 sat 95% on RA.  Patient then started to get drowsy and lost consciousness within a few seconds.  Nursing called. He awoke when nursing arrived and vomited a small amount of the water he had consumed earlier. Patient immediately very alert. Vitals obtained at that time: HR 54 bpm, blood pressure 90/46 mmHg. Patient noted to be diaphoretic and stated he felt hot. Nursing continued to attend to patient and he reported he felt recovered and remembered passing out. Nursing contacted surgeon and started bolus of fluid.    Stairs            Wheelchair Mobility    Modified Rankin (Stroke Patients Only)       Balance Overall balance assessment: Needs assistance Sitting-balance support: Bilateral upper extremity supported Sitting balance-Leahy Scale: Good Sitting balance - Comments: Patient steady sitting at edge of bed prior to ambulation.    Standing balance support: Bilateral upper extremity supported;During functional activity Standing balance-Leahy Scale: Fair Standing balance comment: Patient requires BUE support at [facility] for balance.                              Pertinent Vitals/Pain Pain Assessment: No/denies pain    Home Living Family/patient expects to be discharged to:: Private residence Living Arrangements: Children Available Help at  Discharge: Family(daughter will be working from home) Type of Home: House Home Access: Stairs to enter Entrance Stairs-Rails: Right Entrance Stairs-Number of Steps: 3 Home Layout: One level Home Equipment: Shower seat;Bedside commode;Walker - standard;Cane - single point      Prior Function Level of Independence: Independent         Comments: independent in all apects of care. Independent with ambulation without assistive device. Works full time as an Forensic psychologist. Drives.      Hand Dominance        Extremity/Trunk Assessment   Upper Extremity Assessment Upper Extremity Assessment: Overall WFL for tasks assessed    Lower Extremity Assessment Lower Extremity Assessment: RLE deficits/detail RLE Deficits / Details:  Patient demonstrated guarding at the R hip. Grossly 3/5 MMT able to weight bear without buckling while using RW. Required min A to move in bed.  RLE Sensation: WNL    Cervical / Trunk Assessment Cervical / Trunk Assessment: Normal  Communication   Communication: No difficulties  Cognition Arousal/Alertness: Awake/alert Behavior During Therapy: WFL for tasks assessed/performed Overall Cognitive Status: Within Functional Limits for tasks assessed                                 General Comments: Patient lost consciousness for less than 5 minutes after he completed ambulation and was in the chair. Nursing called emergently to room. Patient awoke, became alert and vomited a small amount of water he had just drank. BP 91/46 mmHg, HR 54 bpm. Returned to basline cognition. See assessment for further detials.       General Comments      Exercises Other Exercises Other Exercises: reviewed use of incentive spirometer. Patient required min cuing to improve technique, able to accurately report prescribed frequency of use.  Other Exercises: educated patient on posterior hip precautions and application to functional movement.    Assessment/Plan    PT Assessment Patient needs continued PT services  PT Problem List Decreased strength;Decreased mobility;Decreased range of motion;Decreased knowledge of precautions;Decreased activity tolerance;Cardiopulmonary status limiting activity;Decreased balance;Decreased knowledge of use of DME;Pain       PT Treatment Interventions DME instruction;Gait training;Stair training;Functional mobility training;Therapeutic activities;Therapeutic exercise;Neuromuscular re-education;Balance training;Patient/family education    PT Goals (Current goals can be found in the Care Plan section)  Acute Rehab PT Goals Patient Stated Goal: discharge to daughter's home; return to work PT Goal Formulation: With patient Time For Goal Achievement:  09/29/18 Potential to Achieve Goals: Good    Frequency BID   Barriers to discharge        Co-evaluation               AM-PAC PT "6 Clicks" Mobility  Outcome Measure Help needed turning from your back to your side while in a flat bed without using bedrails?: A Little Help needed moving from lying on your back to sitting on the side of a flat bed without using bedrails?: A Little Help needed moving to and from a bed to a chair (including a wheelchair)?: A Little Help needed standing up from a chair using your arms (e.g., wheelchair or bedside chair)?: A Little Help needed to walk in hospital room?: A Little Help needed climbing 3-5 steps with a railing? : Total 6 Click Score: 16    End of Session Equipment Utilized During Treatment: Gait belt Activity Tolerance: Treatment limited secondary to medical complications (Comment)(Patient lost consciousness a few minutes following ambulation. See assessment  and mobility section for details. ) Patient left: in chair;with chair alarm set;with call bell/phone within reach;with nursing/sitter in room;with SCD's reapplied;Other (comment)(patient requested to stay up in chair, stating he feels fine now; pillows placed to elevate heels and prevent hip adduction) Nurse Communication: Mobility status;Precautions(results of session, need for help when patient lost consciousness) PT Visit Diagnosis: Unsteadiness on feet (R26.81);Muscle weakness (generalized) (M62.81);Difficulty in walking, not elsewhere classified (R26.2);Pain Pain - Right/Left: Right Pain - part of body: Hip    Time: 1630-1730 PT Time Calculation (min) (ACUTE ONLY): 60 min   Charges:   PT Evaluation $PT Eval Moderate Complexity: 1 Mod PT Treatments $Gait Training: 8-22 mins $Therapeutic Activity: 8-22 mins        Everlean Alstrom. Graylon Good, PT, DPT 09/15/18, 5:59 PM

## 2018-09-15 NOTE — Anesthesia Preprocedure Evaluation (Signed)
Anesthesia Evaluation  Patient identified by MRN, date of birth, ID band Patient awake    Reviewed: Allergy & Precautions, H&P , NPO status , Patient's Chart, lab work & pertinent test results, reviewed documented beta blocker date and time   Airway Mallampati: II  TM Distance: >3 FB Neck ROM: full    Dental  (+) Teeth Intact   Pulmonary neg pulmonary ROS,    Pulmonary exam normal        Cardiovascular Exercise Tolerance: Good hypertension, On Medications negative cardio ROS Normal cardiovascular exam Rhythm:regular Rate:Normal     Neuro/Psych negative neurological ROS  negative psych ROS   GI/Hepatic negative GI ROS, Neg liver ROS,   Endo/Other  negative endocrine ROS  Renal/GU negative Renal ROS  negative genitourinary   Musculoskeletal   Abdominal   Peds  Hematology negative hematology ROS (+)   Anesthesia Other Findings Past Medical History: No date: Arthritis No date: Hyperlipidemia No date: Hypertension No date: PVC (premature ventricular contraction) Past Surgical History: 02/06/2016: COLONOSCOPY WITH PROPOFOL; N/A     Comment:  Procedure: COLONOSCOPY WITH PROPOFOL;  Surgeon: Lollie Sails, MD;  Location: [facility] ENDOSCOPY;  Service:               Endoscopy;  Laterality: N/A; No date: ORIF ELBOW FRACTURE BMI    Body Mass Index:  31.10 kg/m     Reproductive/Obstetrics negative OB ROS                             Anesthesia Physical Anesthesia Plan  ASA: II  Anesthesia Plan: General ETT   Post-op Pain Management:    Induction:   PONV Risk Score and Plan:   Airway Management Planned:   Additional Equipment:   Intra-op Plan:   Post-operative Plan:   Informed Consent: I have reviewed the patients History and Physical, chart, labs and discussed the procedure including the risks, benefits and alternatives for the proposed anesthesia with the patient  or authorized representative who has indicated his/her understanding and acceptance.     Dental Advisory Given  Plan Discussed with: CRNA  Anesthesia Plan Comments:         Anesthesia Quick Evaluation

## 2018-09-15 NOTE — Op Note (Signed)
09/15/2018  12:56 PM  Patient:   Terry Suarez.  Pre-Op Diagnosis:   Degenerative joint disease, right hip.  Post-Op Diagnosis:   Same.  Procedure:   Right total hip arthroplasty.  Surgeon:   Pascal Lux, MD  Assistant:   Cameron Proud, PA-C  Anesthesia:   Spinal  Findings:   As above.  Complications:   None  EBL:   200 cc  Fluids:   1000 cc crystalloid  UOP:   300 cc  TT:   None  Drains:   None  Closure:   Staples  Implants:   Biomet press-fit system with a #13 laterally offset Echo femoral stem, a 58 mm acetabular shell with an E-poly hi-wall liner, and a 36 mm ceramic head with a +3 mm neck.  Brief Clinical Note:   The patient is a 68 year old male with a long history of progressively worsening right hip pain. His symptoms have progressed despite medications, activity modification, etc. His history and examination are consistent with degenerative joint disease, confirmed by plain radiographs. He presents at this time for a right total hip arthroplasty.   Procedure:   The patient was brought into the operating room. After adequate spinal anesthesia was obtained, a Foley catheter was placed by the nurse. The patient was repositioned in the left lateral decubitus position and secured using a lateral hip positioner. The right hip and lower extremity were prepped with ChloroPrep solution before being draped sterilely. Preoperative antibiotics were administered. A timeout was performed to verify the appropriate surgical site before a standard posterior approach to the hip was made through an approximately 5-6 inch incision. The incision was carried down through the subcutaneous tissues to expose the gluteal fascia and proximal end of the iliotibial band. These structures were split the length of the incision and the Charnley self-retaining hip retractor placed. The bursal tissues were swept posteriorly to expose the short external rotators. The anterior border of the  piriformis tendon was identified and this plane developed down through the capsule to enter the joint. A flap of tissue was elevated off the posterior aspect of the femoral neck and greater trochanter and retracted posteriorly. This flap included the piriformis tendon, the short external rotators, and the posterior capsule. The soft tissues were elevated off the lateral aspect of the ilium and a large Steinmann pin placed bicortically. With the right leg aligned over the left, a drill bit was placed into the greater trochanter parallel to the Steinmann pin and the distance between these two pins measured in order to optimize leg lengths postoperatively. The drill bit was removed and the hip dislocated. The piriformis fossa was debrided of soft tissues before the intramedullary canal was accessed through this point using a triple step reamer. The canal was reamed sequentially beginning with a #7 tapered reamer and progressing to a #13 tapered reamer. This provided excellent circumferential chatter. Using the appropriate guide, a femoral neck cut was made 10-12 mm above the lesser trochanter. The femoral head was removed.  Attention was directed to the acetabular side. The labrum was debrided circumferentially before the ligamentum teres was removed using a large curette. A line was drawn on the drapes corresponding to the native version of the acetabulum. This line was used as a guide while the acetabulum was reamed sequentially beginning with a 49 mm reamer and progressing to a 57 mm reamer. This provided excellent circumferential chatter. The 57 mm trial acetabulum was positioned and found to fit quite well.  Therefore, the 58 mm acetabular shell was selected and impacted into place with care taken to maintain the appropriate version. The trial high wall liner was inserted.  Attention was redirected to the femoral side. A box osteotome was used to establish version before the canal was broached sequentially  beginning with a #7 broach and progressing to a #13 broach. This was left in place and several trial reductions performed using both a standard and laterally offset neck options, as well as the -6 mm, +0 mm, and +3 mm neck lengths. The permanent E-polyethylene hi-wall liner was impacted into the acetabular shell and its locking mechanism verified using a quarter-inch osteotome. Next, the #13 lateral offset femoral stem was impacted into place with care taken to maintain the appropriate version. A repeat trial reduction was performed using the +0 mm and +3 mm neck lengths. The +3 mm neck length demonstrated excellent stability both in extension and external rotation as well as with flexion to 90 and internal rotation beyond 70. It also was stable in the position of sleep. In addition, leg lengths appeared to be restored appropriately, both by reassessing the position of the right leg over the left, as well as by measuring the distance between the Steinmann pin and the drill bit. The 36 mm ceramic head with the +3 mm neck adapter was impacted onto the stem of the femoral component. The Morse taper locking mechanism was verified using manual distraction before the head was relocated and placed through a range of motion with the findings as described above.  The wound was copiously irrigated with bacitracin saline solution via the jet lavage system before the peri-incisional and pericapsular tissues were injected with 30 cc of 0.5% Sensorcaine with epinephrine and 20 cc of Exparel diluted out to 60 cc with normal saline to help with postoperative analgesia. The posterior flap was reapproximated to the posterior aspect of the greater trochanter using #2 Tycron interrupted sutures placed through drill holes. Several additional #2 Tycron interrupted sutures were used to reinforce this layer of closure. The iliotibial band was reapproximated using #1 Vicryl interrupted sutures before the gluteal fascia was closed using  a running #0 Vicryl suture. At this point, 1 g of transexemic acid in 10 cc of normal saline was injected into the joint to help reduce postoperative bleeding. The subcutaneous tissues were closed in several layers using 2-0 Vicryl interrupted sutures before the skin was closed using staples. A sterile occlusive dressing was applied to the wound before the patient was placed into an abduction wedge pillow. The patient was then rolled back into the supine position on his/her hospital bed before being awakened and returned to the recovery room in satisfactory condition after tolerating the procedure well.

## 2018-09-15 NOTE — Transfer of Care (Signed)
Immediate Anesthesia Transfer of Care Note  Patient: Terry Suarez.  Procedure(s) Performed: TOTAL HIP ARTHROPLASTY RIGHT (Right )  Patient Location: PACU  Anesthesia Type:Spinal  Level of Consciousness: awake, alert  and oriented  Airway & Oxygen Therapy: Patient Spontanous Breathing  Post-op Assessment: Report given to RN and Post -op Vital signs reviewed and stable  Post vital signs: Reviewed and stable  Last Vitals:  Vitals Value Taken Time  BP    Temp    Pulse    Resp    SpO2      Last Pain:  Vitals:   09/15/18 0846  TempSrc: Oral  PainSc: 0-No pain         Complications: No apparent anesthesia complications

## 2018-09-15 NOTE — H&P (Signed)
Paper H&P to be scanned into permanent record. H&P reviewed and patient re-examined. No changes. 

## 2018-09-15 NOTE — Anesthesia Post-op Follow-up Note (Signed)
Anesthesia QCDR form completed.        

## 2018-09-15 NOTE — TOC Initial Note (Signed)
Transition of Care (TOC) - Initial/Assessment Note    Patient Details  Name: Terry Suarez. MRN: [id number] Date of Birth: October 06, 1950  Transition of Care [facility]) CM/SW Contact:    Su Hilt, RN Phone Number: 09/15/2018, 3:56 PM  Clinical Narrative:                 Met with the patient to discuss DC plan and needs He lives home alone but will be going to stay at his daughters in [place], He has a shower seat and a Bedside commode but needs a RW Notified Brad that he needs a walker He was provided choice per Medicare and [facility] The daughter lives at [place] He has [employer] for drug coverage and the address they have is not the same as the address we have so they would not give the cost of the Lovenox, obtained the address that [place] has with the patient and it is [address] Sees Dr Sabra Heck [facility] PCP [facility] Patient has no other needs at this time   Expected Discharge Plan: [place] Barriers to Discharge: Continued Medical Work up   Patient Goals and CMS Choice Patient states their goals for this hospitalization and ongoing recovery are:: go home with his daughter CMS Medicare.gov Compare Post Acute Care list provided to:: Patient Choice offered to / list presented to : Patient  Expected Discharge Plan and Services Expected Discharge Plan: [place]   Discharge Planning Services: CM Consult Post Acute Care Choice: [place] arrangements for the past 2 months: Single Family Home                 DME Arranged: Walker rolling DME Agency: [employer] Date DME Agency Contacted: 09/15/18 Time DME Agency Contacted: [phone] Representative spoke with at DME Agency: [place]: PT [place]: [facility] [employer] Date [place]: 09/15/18 Time [place]: [place] Representative spoke with at [place]:  [place] Arrangements/Services Living arrangements for the past 2 months: [place] Lives with:: Self(will be staying with daughter) Patient language and need for interpreter reviewed:: No Do you feel safe going back to the place where you live?: Yes      Need for Family Participation in Patient Care: No (Comment) Care giver support system in place?: Yes (comment) Current home services: DME(walker and bedside) Criminal Activity/Legal Involvement Pertinent to Current Situation/Hospitalization: No - Comment as needed  Activities of Daily Living Home Assistive Devices/Equipment: None ADL Screening (condition at time of admission) Patient's cognitive ability adequate to safely complete daily activities?: Yes Is the patient deaf or have difficulty hearing?: No Does the patient have difficulty seeing, even when wearing glasses/contacts?: No Does the patient have difficulty concentrating, remembering, or making decisions?: No Patient able to express need for assistance with ADLs?: Yes Does the patient have difficulty dressing or bathing?: No Independently performs ADLs?: Yes (appropriate for developmental age) Does the patient have difficulty walking or climbing stairs?: Yes Weakness of Legs: Right Weakness of Arms/Hands: None  Permission Sought/Granted Permission sought to share information with : Case Manager Permission granted to share information with : Yes, Verbal Permission Granted              Emotional Assessment Appearance:: Appears stated age Attitude/Demeanor/Rapport: Engaged Affect (typically observed): Accepting Orientation: : Oriented to Self,  Oriented to Place, Oriented to  Time, Oriented to Situation Alcohol / Substance Use: Never Used Psych Involvement: No (comment)  Admission diagnosis:  PRIMARY OSTEOARTHRITIS OF RIGHT HIP Patient Active Problem List   Diagnosis Date Noted  . Status post total hip replacement, right 09/15/2018   PCP:   Rusty Aus, MD [facility]   [id number] [id number]: [phone]: [phone]    Social Determinants of Health (SDOH) Interventions    Readmission Risk Interventions No flowsheet data found.

## 2018-09-15 NOTE — Progress Notes (Signed)
This nurse was notified by call loud and PT that something was wrong with pt. Upon entering the room, this nurse found pt sitting up in chair unresponsive. Called out patient's name and he woke up. Was cool and clammy to touch. HR in the 40s and BP 91/46. Pt stated he think he overdid it with PT and got too hot wearing mask. Pt alert and oriented now. MD Poggi notified and verbal orders to give 565ml bolus of NS.

## 2018-09-16 LAB — BASIC METABOLIC PANEL
Anion gap: 5 (ref 5–15)
BUN: 22 mg/dL (ref 8–23)
CO2: 25 mmol/L (ref 22–32)
Calcium: 8.1 mg/dL — ABNORMAL LOW (ref 8.9–10.3)
Chloride: 104 mmol/L (ref 98–111)
Creatinine, Ser: 1.02 mg/dL (ref 0.61–1.24)
GFR calc Af Amer: >60 mL/min (ref >60)
GFR calc non Af Amer: >60 mL/min (ref >60)
Glucose, Bld: 128 mg/dL — ABNORMAL HIGH (ref 70–99)
Potassium: 4.2 mmol/L (ref 3.5–5.1)
Sodium: 134 mmol/L — ABNORMAL LOW (ref 135–145)

## 2018-09-16 LAB — CBC WITH DIFFERENTIAL/PLATELET
Abs Immature Granulocytes: 0.02 10*3/uL (ref 0.00–0.07)
Basophils Absolute: 0 10*3/uL (ref 0.0–0.1)
Basophils Relative: 0 %
Eosinophils Absolute: 0.1 10*3/uL (ref 0.0–0.5)
Eosinophils Relative: 2 %
HCT: 32.6 % — ABNORMAL LOW (ref 39.0–52.0)
Hemoglobin: 11.2 g/dL — ABNORMAL LOW (ref 13.0–17.0)
Immature Granulocytes: 0 %
Lymphocytes Relative: 11 %
Lymphs Abs: 0.8 10*3/uL (ref 0.7–4.0)
MCH: 31.5 pg (ref 26.0–34.0)
MCHC: 34.4 g/dL (ref 30.0–36.0)
MCV: 91.6 fL (ref 80.0–100.0)
Monocytes Absolute: 0.6 10*3/uL (ref 0.1–1.0)
Monocytes Relative: 8 %
Neutro Abs: 6.1 10*3/uL (ref 1.7–7.7)
Neutrophils Relative %: 79 %
Platelets: 164 10*3/uL (ref 150–400)
RBC: 3.56 MIL/uL — ABNORMAL LOW (ref 4.22–5.81)
RDW: 12.5 % (ref 11.5–15.5)
WBC: 7.6 10*3/uL (ref 4.0–10.5)
nRBC: 0 % (ref 0.0–0.2)

## 2018-09-16 MED ORDER — OXYCODONE HCL 5 MG PO TABS: 5.0000 mg | ORAL_TABLET | ORAL | 0 refills | Status: AC | PRN

## 2018-09-16 MED ORDER — ENOXAPARIN SODIUM 40 MG/0.4ML ~~LOC~~ SOLN: 40.0000 mg | SUBCUTANEOUS | 0 refills | Status: AC

## 2018-09-16 NOTE — TOC Progression Note (Addendum)
Transition of Care (TOC) - Progression Note    Patient Details  Name: Lamarkus Nebel. MRN: [id number] Date of Birth: August 24, 1950  Transition of Care [facility]) CM/SW Contact  Su Hilt, RN Phone Number: 09/16/2018, 11:02 AM  Clinical Narrative:     Deductable for RX not met Lovenox will be full price, Provided the patient with an Good RX card and let him know at [facility] the Lovenox is 90.52$ notified the physician that the patient will need a printed RX to use with Good RX, The patient stated that [place] does not pay well for his prescriptions and he anticipated that it would be high cost.  He will use the Good RX card, I educated on how to down load the Good RX App for future use as well, I also let him know he may want to check with [facility] or [facility] for reduced cost meds if his insurance does not cover his regular medications very well, He thanked me for the information.    Expected Discharge Plan: [place] Barriers to Discharge: Continued Medical Work up  Expected Discharge Plan and Services Expected Discharge Plan: [place]   Discharge Planning Services: CM Consult Post Acute Care Choice: [place] arrangements for the past 2 months: Single Family Home                 DME Arranged: Walker rolling DME Agency: [employer] Date DME Agency Contacted: 09/15/18 Time DME Agency Contacted: [phone] Representative spoke with at DME Agency: [place]: PT [place]: [facility] [employer] Date [place]: 09/15/18 Time [place]: [place] Representative spoke with at [place]: [place] ([place]) Interventions    Readmission Risk Interventions No flowsheet data found.

## 2018-09-16 NOTE — Progress Notes (Signed)
Physical Therapy Treatment Patient Details Name: Terry Suarez. MRN: [id number] DOB: 04/22/1951 Today's Date: 09/16/2018    History of Present Illness Terry Bruins. Sachin Ferencz. is a 68 y.o. male who underwent R THA with posterior approach on 09/15/2018 with no noted complications and was admitted to the hospital for recovery. PMH includes arthritis, hyperlipidemia, hypertension, PVCs, ORIF for elbow fracture.     PT Comments    Pt still up in chair upon arrival, reports feeling good, very pleased with his progress and stay thus far.  Reviewed hip precautions with hand-out and discussed in context of transfers, AMB, bed mobility, and car transfers. Pt still struggling to perform smooth fluid gait pattern, but better verbal understanding recommendations this date. Pt still having some orthostatic vitals with transition to standing, but these resolve with time in standing and remain stable even after short distance AMB. Pt progressing well in general. Will plan to do stairs in AM which is anticipated to go well.    Follow Up Recommendations  Home health PT;Supervision - Intermittent;Supervision for mobility/OOB     Equipment Recommendations  Rolling walker with 5" wheels    Recommendations for Other Services       Precautions / Restrictions Precautions Precautions: Posterior Hip Precaution Booklet Issued: Yes (comment) Restrictions RLE Weight Bearing: Weight bearing as tolerated    Mobility  Bed Mobility               General bed mobility comments: up in chair upon entry  Transfers Overall transfer level: Needs assistance Equipment used: Rolling walker (2 wheeled) Transfers: Sit to/from Stand(>5x in session) Sit to Stand: Supervision         General transfer comment: Pt educated on hip precautions through bed mobility and car transfers  Ambulation/Gait   Gait Distance (Feet): 140 Feet(2x116ft, seated rest between; normostatic this session) Assistive device: Rolling  walker (2 wheeled) Gait Pattern/deviations: WFL(Within Functional Limits);Step-to pattern     General Gait Details: extensive cues for gait training;    Stairs Stairs: (will perform next day; discussed with pt at length)           Wheelchair Mobility    Modified Rankin (Stroke Patients Only)       Balance                                            Cognition Arousal/Alertness: Awake/alert Behavior During Therapy: WFL for tasks assessed/performed Overall Cognitive Status: Within Functional Limits for tasks assessed                                        Exercises Total Joint Exercises Ankle Circles/Pumps: AROM;15 reps;Both;Supine Gluteal Sets: AROM;Strengthening;Both;5 reps;Seated Towel Squeeze: AROM;Both;5 reps;Seated Short Arc Quad: AROM;Strengthening;Seated;Right;5 reps Long Arc Quad: AROM;Seated;Right;5 reps    General Comments        Pertinent Vitals/Pain Pain Assessment: No/denies pain    Home Living                      Prior Function            PT Goals (current goals can now be found in the care plan section) Acute Rehab PT Goals Patient Stated Goal: discharge to daughter's home; return to work PT Goal Formulation: With patient  Time For Goal Achievement: 09/29/18 Potential to Achieve Goals: Good Progress towards PT goals: Progressing toward goals    Frequency    BID      PT Plan Current plan remains appropriate    Co-evaluation              AM-PAC PT "6 Clicks" Mobility   Outcome Measure  Help needed turning from your back to your side while in a flat bed without using bedrails?: A Little Help needed moving from lying on your back to sitting on the side of a flat bed without using bedrails?: A Little Help needed moving to and from a bed to a chair (including a wheelchair)?: A Little Help needed standing up from a chair using your arms (e.g., wheelchair or bedside chair)?: A  Little Help needed to walk in hospital room?: A Little Help needed climbing 3-5 steps with a railing? : A Little 6 Click Score: 18    End of Session Equipment Utilized During Treatment: Gait belt Activity Tolerance: Patient tolerated treatment well(orthostatic BP resolving with activity) Patient left: in chair;with chair alarm set;with call bell/phone within reach;with SCD's reapplied Nurse Communication: Mobility status PT Visit Diagnosis: Unsteadiness on feet (R26.81);Muscle weakness (generalized) (M62.81);Difficulty in walking, not elsewhere classified (R26.2);Pain Pain - Right/Left: Right Pain - part of body: Hip     Time: 1327-1410 PT Time Calculation (min) (ACUTE ONLY): 43 min  Charges:  $Gait Training: 8-22 mins $Therapeutic Exercise: 23-37 mins                     3:53 PM, 09/16/18 Etta Grandchild, PT, DPT Physical Therapist - [facility]  [phone] ([place])    Verdel C 09/16/2018, 3:48 PM

## 2018-09-16 NOTE — Discharge Instructions (Signed)
Instructions after Total Hip Replacement     J. Jeffrey Poggi, M.D.  J. Lance Damali Broadfoot, PA-C     Dept. of Orthopaedics & Sports Medicine  [facility]  [address]  [place], [place]  [address]  Phone: [phone]   Fax: [phone]    DIET: . Drink plenty of non-alcoholic fluids. . Resume your normal diet. Include foods high in fiber.  ACTIVITY:  . You may use crutches or a walker with weight-bearing as tolerated, unless instructed otherwise. . You may be weaned off of the walker or crutches by your Physical Therapist.  . Do NOT reach below the level of your knees or cross your legs until allowed.    . Continue doing gentle exercises. Exercising will reduce the pain and swelling, increase motion, and prevent muscle weakness.   . Please continue to use the TED compression stockings for 6 weeks. You may remove the stockings at night, but should reapply them in the morning. . Do not drive or operate any equipment until instructed.  WOUND CARE:  . Continue to use ice packs periodically to reduce pain and swelling. . Keep the incision clean and dry. . You may bathe or shower after the staples are removed at the first office visit following surgery.  MEDICATIONS: . You may resume your regular medications. . Please take the pain medication as prescribed on the medication. . Do not take pain medication on an empty stomach. . You have been given a prescription for a blood thinner to prevent blood clots. Please take the medication as instructed. (NOTE: After completing a 2 week course of Lovenox, take one Enteric-coated aspirin once a day.) . Pain medications and iron supplements can cause constipation. Use a stool softener (Senokot or Colace) on a daily basis and a laxative (dulcolax or miralax) as needed. . Do not drive or drink alcoholic beverages when taking pain medications.  CALL THE OFFICE FOR: . Temperature above 101 degrees . Excessive bleeding or drainage on the  dressing. . Excessive swelling, coldness, or paleness of the toes. . Persistent nausea and vomiting.  FOLLOW-UP:  . You should have an appointment to return to the office in 2 weeks after surgery. . Arrangements have been made for continuation of Physical Therapy (either home therapy or outpatient therapy).  

## 2018-09-16 NOTE — Progress Notes (Signed)
  Ortho vitals note from PT session this morning.    09/16/18 1015  Therapy Vitals  Patient Position (if appropriate) Orthostatic Vitals  Orthostatic Lying   BP- Lying 146/73  Pulse- Lying 76  Orthostatic Sitting  BP- Sitting 142/69  Pulse- Sitting 81  Orthostatic Standing at 3 minutes  Pulse- Standing at 3 minutes 97  BP- Standing at 3 minutes (!) 80/65 (after 190ft AMB, pt assymptomatic;)  Orthostatic Seated s/p AMB   BP- Sitting 127/66  Pulse- Sitting 82     Oxygen Therapy  SpO2 99 %    10:36 AM, 09/16/18 Etta Grandchild, PT, DPT Physical Therapist - [facility]  [phone] [facility])

## 2018-09-16 NOTE — TOC Benefit Eligibility Note (Signed)
Transition of Care [facility]) Benefit Eligibility Note    Patient Details  Name: Terry Suarez. MRN: [id number] Date of Birth: [birth year]   Medication/Dose: Lovenox '40mg'$  once daily for 14 days  Covered?: No  Prescription Coverage Preferred Pharmacy: Terry Suarez, [facility], [facility], [facility]  Spoke with Person/Company/Phone Number:: Terry Suarez with [employer] [place]: [phone]  Co-Pay: If name brand approved, considered Tier 4.  Subject to deductible, then 45% co-insurance once deductible met.   Prior Approval: Terry Suarez requires PA: [phone] opt 5)  Deductible: Unmet($305 deductible on plan for Tier 3, 4, and 5 medications.  $79.08 met as of time of call.)  Additional Notes: Generic Enoxaparin covered with no PA required.  Considered Tier 4.  Subject to $305 deductible.  Once met, responsible for 45% co-insurance for medication.      Terry Suarez Phone Number: [phone] or [phone] 09/16/2018, 10:36 AM

## 2018-09-16 NOTE — Progress Notes (Signed)
Orthostatic Vitals taken during Physical Therapy Session   09/16/18 1400  Therapy Vitals  Patient Position (if appropriate) Orthostatic Vitals  Orthostatic Sitting  BP- Sitting 121/62  Pulse- Sitting 69  Orthostatic Standing at 0 minutes  BP- Standing at 0 minutes 96/70  Pulse- Standing at 0 minutes 92  Orthostatic Standing at 90 seconds  BP- Standing at 3 minutes 114/71  Pulse- Standing at 3 minutes 95   Orthostatic Standing at 3 minutes  BP- Standing at 3 minutes 121/66  Pulse- Standing at 3 minutes 94   Orthostatic Standing after AMB  BP- Standing after AMB 125/79  Pulse- Standing after AMB 97    Taken during 2nd PT session.   3:42 PM, 09/16/18 Etta Grandchild, PT, DPT Physical Therapist - [facility]  [phone] [facility])

## 2018-09-16 NOTE — Progress Notes (Signed)
  Subjective: 1 Day Post-Op Procedure(s) (LRB): TOTAL HIP ARTHROPLASTY RIGHT (Right) Patient reports pain as mild.   Patient is well, and has had no acute complaints or problems Plan is to go Home after hospital stay. Negative for chest pain and shortness of breath Fever: no Gastrointestinal:Negative for nausea and vomiting Patient did become hypotensive after PT yesterday, 517ml bolus given, reports feeling better this morning.  Objective: Vital signs in last 24 hours: Temp:  [96.1 F (35.6 C)-98.2 F (36.8 C)] 97.7 F (36.5 C) (05/22 0357) Pulse Rate:  [50-70] 67 (05/22 0357) Resp:  [12-18] 18 (05/22 0357) BP: (81-151)/(46-84) 111/64 (05/22 0357) SpO2:  [97 %-100 %] 100 % (05/22 0357) Weight:  [101.2 kg] 101.2 kg (05/21 0846)  Intake/Output from previous day:  Intake/Output Summary (Last 24 hours) at 09/16/2018 0739 Last data filed at 09/16/2018 0620 Gross per 24 hour  Intake 1740 ml  Output 1450 ml  Net 290 ml    Intake/Output this shift: No intake/output data recorded.  Labs: Recent Labs    09/16/18 0331  HGB 11.2*   Recent Labs    09/16/18 0331  WBC 7.6  RBC 3.56*  HCT 32.6*  PLT 164   Recent Labs    09/16/18 0331  NA 134*  K 4.2  CL 104  CO2 25  BUN 22  CREATININE 1.02  GLUCOSE 128*  CALCIUM 8.1*   No results for input(s): LABPT, INR in the last 72 hours.   EXAM General - Patient is Alert, Appropriate and Oriented Extremity - ABD soft Sensation intact distally Intact pulses distally Dorsiflexion/Plantar flexion intact Incision: dressing C/D/I No cellulitis present Dressing/Incision - clean, dry, no drainage Motor Function - intact, moving foot and toes well on exam.   Past Medical History:  Diagnosis Date  . Arthritis   . Hyperlipidemia   . Hypertension   . PVC (premature ventricular contraction)     Assessment/Plan: 1 Day Post-Op Procedure(s) (LRB): TOTAL HIP ARTHROPLASTY RIGHT (Right) Active Problems:   Status post total hip  replacement, right  Estimated body mass index is 31.1 kg/m as calculated from the following:   Height as of this encounter: 5\' 11"  (1.803 m).   Weight as of this encounter: 101.2 kg. Advance diet Up with therapy D/C IV fluids when tolerating po intake.  Labs reviewed this AM, Hg 11.2 this AM. Reports that he is passing gas. Up with therapy today, monitor BP today. Begin working on BM. Plan for discharge home tomorrow.  DVT Prophylaxis - Lovenox, Foot Pumps and TED hose Weight-Bearing as tolerated to right leg  J. Cameron Proud, PA-C [facility] 09/16/2018, 7:39 AM

## 2018-09-16 NOTE — TOC Progression Note (Addendum)
Transition of Care (TOC) - Progression Note    Patient Details  Name: Terry Suarez. MRN: [id number] Date of Birth: 1950-06-06  Transition of Care [facility]) CM/SW Contact  Su Hilt, RN Phone Number: 09/16/2018, 10:13 AM  Clinical Narrative:     Sherre Poot and [facility] will not provide the Lovenox price, The address was confirmed with the patient and [employer] state that they have a different address on file, [facility] not [facility]ox over the phone without the RX on hand and will not allow this CM to call in the RX, they said that they have stopped allowing that due to the Lovenox RX changing in the past and they end op stuck with the medication.      I notified the patient that I am unable to obtain the price for the RX for Lovenox, he states understanding and provided another address that the [facility] may have as [place], [address], will try again with [facility]   Expected Discharge Plan: [place] Barriers to Discharge: Continued Medical Work up  Expected Discharge Plan and Services Expected Discharge Plan: [place]   Discharge Planning Services: CM Consult Post Acute Care Choice: [place] arrangements for the past 2 months: Single Family Home                 DME Arranged: Walker rolling DME Agency: [employer] Date DME Agency Contacted: 09/15/18 Time DME Agency Contacted: [phone] Representative spoke with at DME Agency: [place]: PT [place]: [facility] [employer] Date [place]: 09/15/18 Time [place]: [place] Representative spoke with at [place]: [place] ([place]) Interventions    Readmission Risk Interventions No flowsheet data found.

## 2018-09-16 NOTE — Progress Notes (Signed)
Physical Therapy Treatment Patient Details Name: Terry Suarez. MRN: [id number] DOB: October 03, 1950 Today's Date: 09/16/2018    History of Present Illness Terry Suarez. Terry Suarez. is a 68 y.o. male who underwent R THA with posterior approach on 09/15/2018 with no noted complications and was admitted to the hospital for recovery. PMH includes arthritis, hyperlipidemia, hypertension, PVCs, ORIF for elbow fracture.     PT Comments    Pt in bed upon entry agreeable to participate. Reviewed posterior hip precautions with patient and continued with HEP training and education. Pt normostatic in supine and sitting, BP flat, but after AMB 154f, significant BP drop from 146/73Hg to 80/656mg. Of note pt experienced a LOC last night after AMB into hallway, which correlated with a similar BP drop. Pt given cues for gait correction, which he is able to partially perform. Progressing well, but will need more stable BP prior to stairs training. Pt has already met AMB goals for DC.   Follow Up Recommendations  Home health PT;Supervision - Intermittent;Supervision for mobility/OOB     Equipment Recommendations  Rolling walker with 5" wheels    Recommendations for Other Services OT consult     Precautions / Restrictions Precautions Precautions: Posterior Hip Restrictions RLE Weight Bearing: Weight bearing as tolerated    Mobility  Bed Mobility Overal bed mobility: Needs Assistance Bed Mobility: Supine to Sit     Supine to sit: Supervision     General bed mobility comments: Pt educated on hip precuations through bed mobility.   Transfers Overall transfer level: Needs assistance Equipment used: Rolling walker (2 wheeled) Transfers: Sit to/from Stand Sit to Stand: Supervision         General transfer comment: Pt educated on hip precuations through bed mobility.   Ambulation/Gait Ambulation/Gait assistance: Min guard Gait Distance (Feet): 140 Feet(1405fthen standing BP check, then  78f58fAssistive device: Rolling walker (2 wheeled) Gait Pattern/deviations: WFL(Within Functional Limits);Step-to pattern     General Gait Details: extensive cues for gait training; checked stnading BP afetr AMB and noted SBP drop of nearly 60mm33malbeit pt assymptomatic   Stairs Stairs: (deferred 2/2/ BP drop in AMB)           Wheelchair Mobility    Modified Rankin (Stroke Patients Only)       Balance Overall balance assessment: Modified Independent;Mild deficits observed, not formally tested;No apparent balance deficits (not formally assessed)                                          Cognition Arousal/Alertness: Awake/alert Behavior During Therapy: WFL for tasks assessed/performed Overall Cognitive Status: Within Functional Limits for tasks assessed                                        Exercises Total Joint Exercises Ankle Circles/Pumps: AROM;15 reps;Both;Supine Heel Slides: AAROM;Supine;Right;10 reps;Limitations Heel Slides Limitations: slow d/t pain  Hip ABduction/ADduction: AAROM;Right;15 reps;Limitations Hip Abduction/Adduction Limitations: slow d/t pain  Bridges: 10 reps;Both;Supine(good clearance of buttocks from mattress)    General Comments        Pertinent Vitals/Pain Pain Assessment: 0-10 Pain Score: 2  Pain Location: surgical site Pain Descriptors / Indicators: Tightness;Operative site guarding Pain Intervention(s): Limited activity within patient's tolerance;Monitored during session;RN gave pain meds during session    Home Living  Prior Function            PT Goals (current goals can now be found in the care plan section) Acute Rehab PT Goals Patient Stated Goal: discharge to daughter's home; return to work PT Goal Formulation: With patient Time For Goal Achievement: 09/29/18 Potential to Achieve Goals: Good Progress towards PT goals: Progressing toward goals     Frequency    BID      PT Plan Current plan remains appropriate    Co-evaluation              AM-PAC PT "6 Clicks" Mobility   Outcome Measure  Help needed turning from your back to your side while in a flat bed without using bedrails?: A Little Help needed moving from lying on your back to sitting on the side of a flat bed without using bedrails?: A Little Help needed moving to and from a bed to a chair (including a wheelchair)?: A Little Help needed standing up from a chair using your arms (e.g., wheelchair or bedside chair)?: A Little Help needed to walk in hospital room?: A Little Help needed climbing 3-5 steps with a railing? : A Little 6 Click Score: 18    End of Session Equipment Utilized During Treatment: Gait belt Activity Tolerance: Patient tolerated treatment well;Treatment limited secondary to medical complications (Comment)(BP drop ) Patient left: in chair;with chair alarm set;with call bell/phone within reach;with nursing/sitter in room;with SCD's reapplied Nurse Communication: Mobility status PT Visit Diagnosis: Unsteadiness on feet (R26.81);Muscle weakness (generalized) (M62.81);Difficulty in walking, not elsewhere classified (R26.2);Pain Pain - Right/Left: Right Pain - part of body: Hip     Time: 7493-5521 PT Time Calculation (min) (ACUTE ONLY): 54 min  Charges:  $Gait Training: 23-37 mins $Therapeutic Exercise: 23-37 mins                     11:41 AM, 09/16/18 Etta Grandchild, PT, DPT Physical Therapist - [place]  [phone] ([place])     [place] C 09/16/2018, 11:37 AM

## 2018-09-16 NOTE — Discharge Summary (Addendum)
Physician Discharge Summary  Patient ID: Terry Suarez. MRN: [id number] DOB/AGE: 09/12/1950 68 y.o.  Admit date: 09/15/2018 Discharge date: 09/17/18  Admission Diagnoses:  PRIMARY OSTEOARTHRITIS OF RIGHT HIP  Discharge Diagnoses: Patient Active Problem List   Diagnosis Date Noted  . Status post total hip replacement, right 09/15/2018    Past Medical History:  Diagnosis Date  . Arthritis   . Hyperlipidemia   . Hypertension   . PVC (premature ventricular contraction)      Transfusion: None.   Consultants (if any):   Discharged Condition: Improved  Hospital Course: Terry Suarez. is an 68 y.o. male who was admitted 09/15/2018 with a diagnosis of primary osteoarthritis of the right hip and went to the operating room on 09/15/2018 and underwent the above named procedures.    Surgeries: Procedure(s): TOTAL HIP ARTHROPLASTY RIGHT on 09/15/2018 Patient tolerated the surgery well. Taken to PACU where she was stabilized and then transferred to the orthopedic floor.  Started on Lovenox 40mg  q 24 hrs. Foot pumps applied bilaterally at 80 mm. Heels elevated on bed with rolled towels. No evidence of DVT. Negative Homan. Physical therapy started on day #1 for gait training and transfer. OT started day #1 for ADL and assisted devices.  On the day of surgery after working with PT the patient did become hypotensive requiring a 535ml fluid bolus.  Patient recovered well and was able to perform all activities with PT the following day.  Patient's IV was removed on POD2.  Foley removed on POD1.  Implants: Biomet press-fit system with a #13 laterally offset Echo femoral stem, a 58 mm acetabular shell with an E-poly hi-wall liner, and a 36 mm ceramic head with a +3 mm neck.  He was given perioperative antibiotics:  Anti-infectives (From admission, onward)   Start     Dose/Rate Route Frequency Ordered Stop   09/15/18 1700  ceFAZolin (ANCEF) IVPB 2g/100 mL premix     2 g 200 mL/hr  over 30 Minutes Intravenous Every 6 hours 09/15/18 1408 09/16/18 0650   09/15/18 0000  ceFAZolin (ANCEF) IVPB 2g/100 mL premix     2 g 200 mL/hr over 30 Minutes Intravenous  Once 09/14/18 2353 09/15/18 1101    .  He was given sequential compression devices, early ambulation, and Lovenox for DVT prophylaxis.  He benefited maximally from the hospital stay and there were no complications.    Recent vital signs:  Vitals:   09/16/18 1929 09/17/18 0525  BP: (!) 126/59 115/62  Pulse: 78 75  Resp: 19 19  Temp: 98.6 F (37 C) 98.6 F (37 C)  SpO2: 99% 100%    Recent laboratory studies:  Lab Results  Component Value Date   HGB 11.2 (L) 09/16/2018   HGB 15.2 09/12/2018   Lab Results  Component Value Date   WBC 7.6 09/16/2018   PLT 164 09/16/2018   No results found for: INR Lab Results  Component Value Date   NA 137 09/17/2018   K 3.8 09/17/2018   CL 105 09/17/2018   CO2 24 09/17/2018   BUN 22 09/17/2018   CREATININE 0.95 09/17/2018   GLUCOSE 103 (H) 09/17/2018    Discharge Medications:   Allergies as of 09/17/2018   No Known Allergies     Medication List    TAKE these medications   atorvastatin 10 MG tablet Commonly known as:  LIPITOR Take 10 mg by mouth every morning.   enoxaparin 40 MG/0.4ML injection Commonly known as:  LOVENOX  Inject 0.4 mLs (40 mg total) into the skin daily.   hydrochlorothiazide 25 MG tablet Commonly known as:  HYDRODIURIL Take 25 mg by mouth every morning.   losartan 100 MG tablet Commonly known as:  COZAAR Take 100 mg by mouth every morning.   naproxen sodium 220 MG tablet Commonly known as:  ALEVE Take 220 mg by mouth every morning.   oxyCODONE 5 MG immediate release tablet Commonly known as:  Oxy IR/ROXICODONE Take 1-2 tablets (5-10 mg total) by mouth every 4 (four) hours as needed for moderate pain.            Durable Medical Equipment  (From admission, onward)         Start     Ordered   09/15/18 1408  DME  Bedside commode  Once    Question:  Patient needs a bedside commode to treat with the following condition  Answer:  Status post total hip replacement, right   09/15/18 1408   09/15/18 1408  DME 3 n 1  Once     09/15/18 1408   09/15/18 1408  DME Walker rolling  Once    Question:  Patient needs a walker to treat with the following condition  Answer:  Status post total hip replacement, right   09/15/18 1408         Diagnostic Studies: Dg Hip Unilat W Or W/o Pelvis 2-3 Views Right  Result Date: 09/15/2018 CLINICAL DATA:  Right hip replacement EXAM: DG HIP (WITH OR WITHOUT PELVIS) 2-3V RIGHT COMPARISON:  None. FINDINGS: Total hip replacement on the right in satisfactory position and alignment. No fracture or complication IMPRESSION: Satisfactory right hip replacement Electronically Signed   By: Franchot Gallo M.D.   On: 09/15/2018 13:29   Disposition: Discharge disposition: 01-Home or Self Care     Plan for discharge home pending progress with PT on 09/17/18.  Follow-up Information    Lattie Corns, PA-C Follow up in 14 day(s).   Specialty:  Physician Assistant Why:  Terry Suarez information: [place] [id number] [phone]          Signed: Prescott Parma, Santiaga Butzin PA-C 09/17/2018, 6:52 AM

## 2018-09-17 LAB — BASIC METABOLIC PANEL
Anion gap: 8 (ref 5–15)
BUN: 22 mg/dL (ref 8–23)
CO2: 24 mmol/L (ref 22–32)
Calcium: 8.2 mg/dL — ABNORMAL LOW (ref 8.9–10.3)
Chloride: 105 mmol/L (ref 98–111)
Creatinine, Ser: 0.95 mg/dL (ref 0.61–1.24)
GFR calc Af Amer: >60 mL/min (ref >60)
GFR calc non Af Amer: >60 mL/min (ref >60)
Glucose, Bld: 103 mg/dL — ABNORMAL HIGH (ref 70–99)
Potassium: 3.8 mmol/L (ref 3.5–5.1)
Sodium: 137 mmol/L (ref 135–145)

## 2018-09-17 NOTE — Progress Notes (Signed)
Physical Therapy Treatment Patient Details Name: Terry Suarez. MRN: [id number] DOB: [birth year] Today's Date: 09/17/2018    History of Present Illness Terry Suarez. Terry Pabst. is a 68 y.o. male who underwent R THA with posterior approach on 09/15/2018 with no noted complications and was admitted to the hospital for recovery. PMH includes arthritis, hyperlipidemia, hypertension, PVCs, ORIF for elbow fracture.     PT Comments    Increased soreness today in general.  Out of bed with light min a for LE assist.  Stood and was able to ambulate 200' to rehab gym to complete stair training with min guard.  Overall tolerated well with no c/o dizziness noted.  Remained on commode upon return to room with call bell in reach.  Nurse tech aware.   Follow Up Recommendations  Home health PT;Supervision - Intermittent;Supervision for mobility/OOB     Equipment Recommendations  Rolling walker with 5" wheels    Recommendations for Other Services       Precautions / Restrictions Precautions Precautions: Posterior Hip Precaution Comments: No R hip adduction, flexion past 90, or internal rotation Restrictions Weight Bearing Restrictions: Yes RLE Weight Bearing: Weight bearing as tolerated    Mobility  Bed Mobility Overal bed mobility: Needs Assistance Bed Mobility: Supine to Sit     Supine to sit: Min assist     General bed mobility comments: increased pain limiting mobility today  Transfers Overall transfer level: Needs assistance Equipment used: Rolling walker (2 wheeled) Transfers: Sit to/from Stand Sit to Stand: Min guard            Ambulation/Gait Ambulation/Gait assistance: Min guard Gait Distance (Feet): 200 Feet Assistive device: Rolling walker (2 wheeled) Gait Pattern/deviations: Step-to pattern;Step-through pattern Gait velocity: decreased   General Gait Details: irregular pattern with verbal cues and education provided   Stairs Stairs: Yes Stairs assistance:  Min guard Stair Management: One rail Right;Step to pattern Number of Stairs: 4     Wheelchair Mobility    Modified Rankin (Stroke Patients Only)       Balance Overall balance assessment: Needs assistance Sitting-balance support: Bilateral upper extremity supported Sitting balance-Leahy Scale: Good     Standing balance support: Bilateral upper extremity supported;During functional activity Standing balance-Leahy Scale: Fair Standing balance comment: Patient requires BUE support at [facility] for balance.                             Cognition Arousal/Alertness: Awake/alert Behavior During Therapy: WFL for tasks assessed/performed Overall Cognitive Status: Within Functional Limits for tasks assessed                                        Exercises      General Comments        Pertinent Vitals/Pain Pain Assessment: Faces Faces Pain Scale: Hurts even more Pain Location: some increased pain today. Pain Descriptors / Indicators: Sore;Guarding Pain Intervention(s): Limited activity within patient's tolerance;Monitored during session;Repositioned    Home Living                      Prior Function            PT Goals (current goals can now be found in the care plan section) Progress towards PT goals: Progressing toward goals    Frequency    BID  PT Plan Current plan remains appropriate    Co-evaluation              AM-PAC PT "6 Clicks" Mobility   Outcome Measure  Help needed turning from your back to your side while in a flat bed without using bedrails?: A Little Help needed moving from lying on your back to sitting on the side of a flat bed without using bedrails?: A Little Help needed moving to and from a bed to a chair (including a wheelchair)?: A Little Help needed standing up from a chair using your arms (e.g., wheelchair or bedside chair)?: A Little Help needed to walk in hospital room?: A Little Help needed  climbing 3-5 steps with a railing? : A Little 6 Click Score: 18    End of Session Equipment Utilized During Treatment: Gait belt Activity Tolerance: Patient tolerated treatment well Patient left: Other (comment) Nurse Communication: Mobility status;Other (comment) Pain - Right/Left: Right Pain - part of body: Hip     Time: 1610-9604 PT Time Calculation (min) (ACUTE ONLY): 28 min  Charges:  $Gait Training: 8-22 mins $Therapeutic Activity: 8-22 mins                     Chesley Noon, PTA 09/17/18, 12:22 PM

## 2018-09-17 NOTE — Progress Notes (Signed)
Discharge instructions and prescriptions given to pt. IV removed. Honeycomb dressing changed. No questions or concerns from pt at this time. Will assist pt with getting dressed and await ride for discharge home with daughter.

## 2018-09-17 NOTE — Progress Notes (Signed)
  Subjective: 2 Days Post-Op Procedure(s) (LRB): TOTAL HIP ARTHROPLASTY RIGHT (Right) Patient reports pain as mild.   Patient is well, and has had no acute complaints or problems Plan is to go Home after hospital stay. Negative for chest pain and shortness of breath Fever: no Gastrointestinal:Negative for nausea and vomiting  Objective: Vital signs in last 24 hours: Temp:  [98.3 F (36.8 C)-98.6 F (37 C)] 98.6 F (37 C) (05/23 0525) Pulse Rate:  [67-78] 75 (05/23 0525) Resp:  [18-19] 19 (05/23 0525) BP: (115-127)/(59-65) 115/62 (05/23 0525) SpO2:  [97 %-100 %] 100 % (05/23 0525)  Intake/Output from previous day:  Intake/Output Summary (Last 24 hours) at 09/17/2018 0648 Last data filed at 09/16/2018 1904 Gross per 24 hour  Intake 480 ml  Output 1200 ml  Net -720 ml    Intake/Output this shift: Total I/O In: 120 [P.O.:120] Out: -   Labs: Recent Labs    09/16/18 0331  HGB 11.2*   Recent Labs    09/16/18 0331  WBC 7.6  RBC 3.56*  HCT 32.6*  PLT 164   Recent Labs    09/16/18 0331 09/17/18 0412  NA 134* 137  K 4.2 3.8  CL 104 105  CO2 25 24  BUN 22 22  CREATININE 1.02 0.95  GLUCOSE 128* 103*  CALCIUM 8.1* 8.2*   No results for input(s): LABPT, INR in the last 72 hours.   EXAM General - Patient is Alert, Appropriate and Oriented Extremity - ABD soft Sensation intact distally Intact pulses distally Dorsiflexion/Plantar flexion intact Incision: dressing C/D/I No cellulitis present Dressing/Incision - clean, dry, no drainage Motor Function - intact, moving foot and toes well on exam.  Ambulated 140 feet with physical therapy  Past Medical History:  Diagnosis Date  . Arthritis   . Hyperlipidemia   . Hypertension   . PVC (premature ventricular contraction)     Assessment/Plan: 2 Days Post-Op Procedure(s) (LRB): TOTAL HIP ARTHROPLASTY RIGHT (Right) Active Problems:   Status post total hip replacement, right  Estimated body mass index is 31.1  kg/m as calculated from the following:   Height as of this encounter: 5\' 11"  (1.803 m).   Weight as of this encounter: 101.2 kg. Advance diet Up with therapy D/C IV fluids when tolerating po intake.  Reports that he is passing gas.  No bowel movement yet. Up with therapy today, monitor BP today. Plan for discharge home today.  DVT Prophylaxis - Lovenox, Foot Pumps and TED hose Weight-Bearing as tolerated to right leg  Reche Dixon, PA-C [facility] 09/17/2018, 6:48 AM

## 2018-09-17 NOTE — TOC Transition Note (Signed)
Transition of Care [facility]) - CM/SW Discharge Note   Patient Details  Name: Terry Suarez. MRN: [id number] Date of Birth: 1951-03-01  [facility] CM/SW Contact:  Latanya Maudlin, RN Phone Number: 09/17/2018, 8:24 AM   Clinical Narrative:  Patient to be discharged per MD order. Orders in place for home health services. Patient was previously set up with [facility] for PT services, verified completed referral with Helene Kelp. RW delivered to bedside via [employer]. Patient has coupon for lovenox. Set to dc to daughters home. Family to transport.       Final next level of care: [place] Barriers to Discharge: No Barriers Identified   Patient Goals and CMS Choice Patient states their goals for this hospitalization and ongoing recovery are:: go home with his daughter CMS Medicare.gov Compare Post Acute Care list provided to:: Patient Choice offered to / list presented to : Patient  Discharge Placement                       Discharge Plan and Services   Discharge Planning Services: CM Consult Post Acute Care Choice: Home Health          DME Arranged: Walker rolling DME Agency: [employer] Date DME Agency Contacted: 09/15/18 Time DME Agency Contacted: [phone] Representative spoke with at DME Agency: [place]: PT [place]: [facility] Date [place]: 09/17/18 Time [place]: [phone] Representative spoke with at [place]: [place] (Central) Interventions     Readmission Risk Interventions Readmission Risk Prevention Plan 09/17/2018  Post Dischage Appt Complete  Medication Screening Complete  Transportation Screening Complete  Some recent data might be hidden

## 2018-09-18 ENCOUNTER — Encounter: Payer: Self-pay | Admitting: Surgery

## 2018-09-20 LAB — SURGICAL PATHOLOGY

## 2018-09-22 NOTE — H&P (Signed)
Subjective:  Chief complaint:  Right hip pain.  The patient is a 68 y.o. male with a several year history of gradually worsening right hip/groin pain secondary to degenerative joint disease. Over the past several months, he has noted increased pain in the hip/groin region. His symptoms are aggravated by any prolonged standing or ambulation. He also has difficulty ascending/descending stairs, as well as having an achy discomfort at night. He rates his pain at 4-5/10, for which he has been taking only Aleve as necessary with moderate alleviation of his symptoms. He denies any recent or remote injury to the hip, and denies any back pain. He also denies any numbness or paresthesias down his leg to his foot, and denies any bowel or bladder complaints. He presents at this time for a right total hip replacement.  Patient Active Problem List   Diagnosis Date Noted  . Status post total hip replacement, right 09/15/2018   Past Medical History:  Diagnosis Date  . Arthritis   . Hyperlipidemia   . Hypertension   . PVC (premature ventricular contraction)     Past Surgical History:  Procedure Laterality Date  . COLONOSCOPY WITH PROPOFOL N/A 02/06/2016   Procedure: COLONOSCOPY WITH PROPOFOL;  Surgeon: Lollie Sails, MD;  Location: [facility] ENDOSCOPY;  Service: Endoscopy;  Laterality: N/A;  . ORIF ELBOW FRACTURE    . TOTAL HIP ARTHROPLASTY Right 09/15/2018   Procedure: TOTAL HIP ARTHROPLASTY RIGHT;  Surgeon: Corky Mull, MD;  Location: [facility] ORS;  Service: Orthopedics;  Laterality: Right;    No medications prior to admission.   No Known Allergies  Social History   Tobacco Use  . Smoking status: Never Smoker  . Smokeless tobacco: Never Used  Substance Use Topics  . Alcohol use: Yes    Alcohol/week: 3.0 standard drinks    Types: 3 Glasses of wine per week    Comment: 2-3 glass wine/ week    History reviewed. No pertinent family history.   Review of Systems: As noted above. The patient denies  any chest pain, shortness of breath, nausea, vomiting, diarrhea, constipation, belly pain, blood in his/her stool, or burning with urination.  Objective:    Physical Exam: General:  Alert, no acute distress Psychiatric:  Patient is competent for consent with normal mood and affect Cardiovascular:  RRR  Respiratory:  Clear to auscultation. No wheezing. Non-labored breathing GI:  Abdomen is soft and non-tender Skin:  No lesions in the area of chief complaint Neurologic:  Sensation intact distally Lymphatic:  No axillary or cervical lymphadenopathy  Orthopedic Exam:  Orthopedic examination is limited to the right hip and lower extremity. Skin inspection of the right hip is unremarkable. There is no swelling, erythema, ecchymosis, abrasions, or other skin abnormalities identified. The patient is able to arise from a seated position without difficulty, and ambulates with a slight limp, favoring his right leg, but does not use any assistive devices. In stance, his pelvis is essentially level. He is able to heel raise and toe raise without difficulty. He also is able to march in place without any discomfort or hip abductor weakness. Hip motion is limited. He can tolerate hip flexion to 90 degrees, internal rotation to 5 degrees, and external rotation to 30 degrees. He has pain with both internal and external rotation. He is neurovascular intact to the right lower extremity and foot.  Imaging Review: Recent x-rays of the pelvis and right hip are available for review. These films demonstrate advanced degenerative changes of the right hip  joint with complete loss of the clear space superiorly. Subchondral cystic changes are noted on both the femoral and acetabular sides, as well as some lateral subluxation of the hip. No acute fractures or other lytic lesions are identified.  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  Plan: The treatment options, including both surgical and  nonsurgical choices, have been discussed in detail with the patient. The patient would like to proceed with surgical intervention to include a right total hip arthroplasty. The risks (including bleeding, infection, nerve and/or blood vessel injury, persistent or recurrent pain, loosening of and/or failure of the components, leg length inequality, dislocation, need for further surgery, blood clots, strokes, heart attacks and/or arrhythmias, pneumonia, etc.) and benefits of the surgical procedure were discussed. The patient states his understanding and agrees to proceed. He agrees to a blood transfusion if necessary. A formal written consent will be obtained by the nursing staff.

## 2018-09-26 NOTE — Anesthesia Postprocedure Evaluation (Signed)
Anesthesia Post Note  Patient: Terry Suarez.  Procedure(s) Performed: TOTAL HIP ARTHROPLASTY RIGHT (Right )  Patient location during evaluation: PACU Anesthesia Type: General Level of consciousness: awake and alert Pain management: pain level controlled Vital Signs Assessment: post-procedure vital signs reviewed and stable Respiratory status: spontaneous breathing, nonlabored ventilation, respiratory function stable and patient connected to nasal cannula oxygen Cardiovascular status: blood pressure returned to baseline and stable Postop Assessment: no apparent nausea or vomiting Anesthetic complications: no     Last Vitals:  Vitals:   09/17/18 0525 09/17/18 0922  BP: 115/62 127/65  Pulse: 75 80  Resp: 19 16  Temp: 37 C 36.9 C  SpO2: 100% 98%    Last Pain:  Vitals:   09/17/18 1025  TempSrc:   PainSc: 0-No pain                 Molli Barrows

## 2021-07-18 ENCOUNTER — Other Ambulatory Visit: Payer: Self-pay

## 2021-07-18 ENCOUNTER — Emergency Department
Admission: EM | Admit: 2021-07-18 | Discharge: 2021-07-18 | Disposition: A | Discharge status: Home / Self Care | Payer: Medicare Other | Attending: Emergency Medicine | Admitting: Emergency Medicine

## 2021-07-18 ENCOUNTER — Emergency Department: Payer: Medicare Other

## 2021-07-18 DIAGNOSIS — I10 Essential (primary) hypertension: Secondary | ICD-10-CM | POA: Insufficient documentation

## 2021-07-18 DIAGNOSIS — R55 Syncope and collapse: Secondary | ICD-10-CM | POA: Insufficient documentation

## 2021-07-18 LAB — CBC
HCT: 45.6 % (ref 39.0–52.0)
Hemoglobin: 15 g/dL (ref 13.0–17.0)
MCH: 30.4 pg (ref 26.0–34.0)
MCHC: 32.9 g/dL (ref 30.0–36.0)
MCV: 92.5 fL (ref 80.0–100.0)
Platelets: 224 10*3/uL (ref 150–400)
RBC: 4.93 MIL/uL (ref 4.22–5.81)
RDW: 12.2 % (ref 11.5–15.5)
WBC: 8.7 10*3/uL (ref 4.0–10.5)
nRBC: 0 % (ref 0.0–0.2)

## 2021-07-18 LAB — TROPONIN I (HIGH SENSITIVITY)
Troponin I (High Sensitivity): 4 ng/L (ref <18)
Troponin I (High Sensitivity): 5 ng/L (ref <18)

## 2021-07-18 LAB — BASIC METABOLIC PANEL
Anion gap: 10 (ref 5–15)
BUN: 20 mg/dL (ref 8–23)
CO2: 25 mmol/L (ref 22–32)
Calcium: 9 mg/dL (ref 8.9–10.3)
Chloride: 104 mmol/L (ref 98–111)
Creatinine, Ser: 0.9 mg/dL (ref 0.61–1.24)
GFR, Estimated: >60 mL/min (ref >60)
Glucose, Bld: 141 mg/dL — ABNORMAL HIGH (ref 70–99)
Potassium: 3.8 mmol/L (ref 3.5–5.1)
Sodium: 139 mmol/L (ref 135–145)

## 2021-07-18 NOTE — ED Triage Notes (Signed)
Pt to ED via [facility]. Pt was at a funeral home and had a syncopal episode. Pt denies CP, Sob, Ha or dizziness. EMS states pt HR would decrease to 40s when vomiting. Pt A&Ox4 on arrival.  ? ?BP 150/100 ?CBG 90 ?95% RA ?22g RW  ? ?

## 2021-07-18 NOTE — ED Notes (Signed)
Pt transported to CT ?

## 2021-07-18 NOTE — ED Notes (Addendum)
Pt provided a meal tray and water by the Day shift RN - pt tolerated food & drink. Family present at the bedside.  ?

## 2021-07-18 NOTE — ED Provider Notes (Signed)
? ?[facility] ?Provider Note ? ? ? Event Date/Time  ? First MD Initiated Contact with Patient 07/18/21 1604   ?  (approximate) ? ? ?History  ? ?Chief Complaint ?Loss of Consciousness ? ? ?HPI ?Terry Suarez. is a 71 y.o. male, history of hypertension, hyperlipidemia, PVC, arthritis, presents to the emergency department for evaluation of syncope.  Patient states that he was sitting down at a funeral for his friend, when he suddenly lost consciousness.  Patient states that according to witnesses, patient slumped over in his chair for 1-2 minutes before spontaneously waking on his own.  Patient states that when he woke up, he walked outside to get some fresh air and drank a Sprite.  He states that he felt fine, however EMS was called advised getting checked out at the emergency department.  Patient does not recall any particular inciting events prior to the syncopal episode.  Endorse some mild nausea during the ambulance ride, however this is subsided.  Denies fever/chills, chest pain, shortness of breath, nausea/vomiting, lightheadedness/dizziness, urinary symptoms, headache, or rashes. ? ?History Limitations: No limitations. ? ?  ? ? ?Physical Exam  ?Triage Vital Signs: ?ED Triage Vitals [07/18/21 1601]  ?Enc Vitals Group  ?   BP   ?   Pulse   ?   Resp   ?   Temp   ?   Temp src   ?   SpO2   ?   Weight 225 lb (102.1 kg)  ?   Height '5\' 11"'$  (1.803 m)  ?   Head Circumference   ?   Peak Flow   ?   Pain Score 0  ?   Pain Loc   ?   Pain Edu?   ?   Excl. in [place]?   ? ? ?Most recent vital signs: ?Vitals:  ? 07/18/21 1850 07/18/21 1930  ?BP: (!) 149/90 (!) 154/70  ?Pulse: 64 70  ?Resp: (!) 28 17  ?Temp:    ?SpO2: 100% 98%  ? ? ?General: Awake, NAD.  ?Skin: Warm, dry.  ?CV: Good peripheral perfusion.  S1 and S2 present.  No murmurs, rubs, or gallops. ?Resp: Normal effort.  Lung sounds clear bilaterally in the apices and bases. ?Abd: Soft, non-tender. No distention.  ?Neuro: At baseline. No gross  neurological deficits.  Cranial nerves II through XII intact.  5/5 strength in upper and lower extremities. ?Other: Patient is able to ambulate on his own without difficulty. ? ?Physical Exam ? ? ? ?ED Results / Procedures / Treatments  ?Labs ?(all labs ordered are listed, but only abnormal results are displayed) ?Labs Reviewed  ?BASIC METABOLIC PANEL - Abnormal; Notable for the following components:  ?    Result Value  ? Glucose, Bld 141 (*)   ? All other components within normal limits  ?CBC  ?TROPONIN I (HIGH SENSITIVITY)  ?TROPONIN I (HIGH SENSITIVITY)  ? ? ? ?EKG ?Sinus rhythm, rate of 66, no T-segment changes, no AV blocks, no axis deviations, normal QRS interval. ? ? ?RADIOLOGY ? ?ED Provider Interpretation: I personally reviewed and interpreted this chest x-ray, no evidence of acute cardiopulmonary process.  I personally viewed head CT as well, no evidence of acute intracranial abnormalities based on my interpretation. ? ?DG Chest 2 View ? ?Result Date: 07/18/2021 ?CLINICAL DATA:  Syncope. EXAM: CHEST - 2 VIEW COMPARISON:  Aug 30, 2004. FINDINGS: Stable cardiomediastinal silhouette. Both lungs are clear. The visualized skeletal structures are unremarkable. IMPRESSION: No active cardiopulmonary disease.  Electronically Signed   By: Marijo Conception M.D.   On: 07/18/2021 16:49  ? ?CT HEAD WO CONTRAST (5MM) ? ?Result Date: 07/18/2021 ?CLINICAL DATA:  Syncope, vomiting, bradycardia EXAM: CT HEAD WITHOUT CONTRAST TECHNIQUE: Contiguous axial images were obtained from the base of the skull through the vertex without intravenous contrast. RADIATION DOSE REDUCTION: This exam was performed according to the departmental dose-optimization program which includes automated exposure control, adjustment of the mA and/or kV according to patient size and/or use of iterative reconstruction technique. COMPARISON:  None. FINDINGS: Brain: No acute infarct or hemorrhage. Lateral ventricles and midline structures are unremarkable. No  acute extra-axial fluid collections. No mass effect. Vascular: No hyperdense vessel or unexpected calcification. Skull: Normal. Negative for fracture or focal lesion. Sinuses/Orbits: No acute finding. Other: None. IMPRESSION: 1. No acute intracranial process. Electronically Signed   By: Randa Ngo M.D.   On: 07/18/2021 17:46   ? ?PROCEDURES: ? ?Critical Care performed: None. ? ?Procedures ? ? ? ?MEDICATIONS ORDERED IN ED: ?Medications - No data to display ? ? ?IMPRESSION / MDM / ASSESSMENT AND PLAN / ED COURSE  ?I reviewed the triage vital signs and the nursing notes. ?             ?               ? ? ?Differential diagnosis includes, but is not limited to, ACS, aortic stenosis, vasovagal syncope, orthostatic hypotension, hypoglycemia, dehydration, electrolyte abnormalities. ? ?ED Course ?Patient appears well.  Vital signs within normal limits.  NAD.  Patient is on continuous cardiac monitoring at this time. ? ?CBC shows no leukocytosis or anemia.  BMP unremarkable for kidney injury or significant electrolyte abnormalities. ? ?Initial EKG unremarkable.  Initial troponin 4.  Second troponin 5.  Unlikely ACS or myocarditis/pericarditis ? ?Chest x-ray shows no acute cardiopulmonary abnormalities. ? ?Head CT shows no evidence of acute intracranial abnormalities. ? ? ?Assessment/Plan ?Presentation consistent with vasovagal syncope, likely secondary to emotional state given his recent attendance to funeral.  Work-up shows no evidence of cardiac pathology.  Head CT is reassuring for no intracranial abnormalities.  We will plan to discharge this patient with primary care follow-up. ? ?Considered admission for this patient, but given his stable presentation, normal vitals, and reassuring work-up, he is unlikely to benefit. ? ?Patient was provided with anticipatory guidance, return precautions, and educational material. Encouraged the patient to return to the emergency department at any time if they begin to experience  any new or worsening symptoms.  ? ?  ? ? ?FINAL CLINICAL IMPRESSION(S) / ED DIAGNOSES  ? ?Final diagnoses:  ?Syncope, unspecified syncope type  ? ? ? ?Rx / DC Orders  ? ?ED Discharge Orders   ? ? None  ? ?  ? ? ? ?Note:  This document was prepared using Dragon voice recognition software and may include unintentional dictation errors. ?  ?Teodoro Spray, [place] ?07/18/21 2007 ? ?  ?Merlyn Lot, MD ?07/18/21 2125 ? ?

## 2021-07-18 NOTE — Discharge Instructions (Addendum)
-  Follow-up with your primary care provider as needed. ?-Return to the emergency department anytime if you begin to experience any new or worsening symptoms ?

## 2021-07-18 NOTE — ED Notes (Signed)
Lab call in regard to troponin not in process  ?

## 2021-07-22 ENCOUNTER — Other Ambulatory Visit: Payer: Self-pay | Admitting: Neurology

## 2021-07-22 DIAGNOSIS — R55 Syncope and collapse: Secondary | ICD-10-CM

## 2021-08-04 ENCOUNTER — Ambulatory Visit
Admission: RE | Admit: 2021-08-04 | Discharge: 2021-08-04 | Disposition: A | Discharge status: Home / Self Care | Payer: Medicare Other | Source: Ambulatory Visit | Attending: Neurology | Admitting: Neurology

## 2021-08-04 DIAGNOSIS — R55 Syncope and collapse: Secondary | ICD-10-CM | POA: Diagnosis present

## 2021-09-19 ENCOUNTER — Other Ambulatory Visit: Payer: Self-pay | Admitting: Physician Assistant

## 2021-09-19 DIAGNOSIS — R9431 Abnormal electrocardiogram [ECG] [EKG]: Secondary | ICD-10-CM

## 2021-09-19 DIAGNOSIS — R55 Syncope and collapse: Secondary | ICD-10-CM

## 2021-09-19 DIAGNOSIS — R9439 Abnormal result of other cardiovascular function study: Secondary | ICD-10-CM

## 2021-09-19 DIAGNOSIS — E785 Hyperlipidemia, unspecified: Secondary | ICD-10-CM

## 2021-09-24 ENCOUNTER — Telehealth (HOSPITAL_COMMUNITY): Payer: Self-pay | Admitting: Emergency Medicine

## 2021-09-24 DIAGNOSIS — R079 Chest pain, unspecified: Secondary | ICD-10-CM

## 2021-09-24 MED ORDER — METOPROLOL TARTRATE 100 MG PO TABS: 100.0000 mg | ORAL_TABLET | Freq: Once | ORAL | 0 refills | Status: AC

## 2021-09-24 NOTE — Telephone Encounter (Signed)
Reaching out to patient to offer assistance regarding upcoming cardiac imaging study; pt verbalizes understanding of appt date/time, parking situation and where to check in, pre-test NPO status and medications ordered, and verified current allergies; name and call back number provided for further questions should they arise Marchia Bond RN Navigator Cardiac Imaging [facility] [phone] office [phone] cell  '100mg'$  metoprolol tartate Denies iv issues Arrival 145pm

## 2021-09-25 ENCOUNTER — Ambulatory Visit
Admission: RE | Admit: 2021-09-25 | Discharge: 2021-09-25 | Disposition: A | Discharge status: Home / Self Care | Payer: Medicare Other | Source: Ambulatory Visit | Attending: Physician Assistant | Admitting: Physician Assistant

## 2021-09-25 ENCOUNTER — Other Ambulatory Visit: Payer: Self-pay | Admitting: Physician Assistant

## 2021-09-25 DIAGNOSIS — R931 Abnormal findings on diagnostic imaging of heart and coronary circulation: Secondary | ICD-10-CM | POA: Insufficient documentation

## 2021-09-25 DIAGNOSIS — I2510 Atherosclerotic heart disease of native coronary artery without angina pectoris: Secondary | ICD-10-CM | POA: Diagnosis not present

## 2021-09-25 DIAGNOSIS — R55 Syncope and collapse: Secondary | ICD-10-CM | POA: Diagnosis present

## 2021-09-25 DIAGNOSIS — E785 Hyperlipidemia, unspecified: Secondary | ICD-10-CM | POA: Diagnosis present

## 2021-09-25 DIAGNOSIS — R9439 Abnormal result of other cardiovascular function study: Secondary | ICD-10-CM | POA: Diagnosis present

## 2021-09-25 DIAGNOSIS — R9431 Abnormal electrocardiogram [ECG] [EKG]: Secondary | ICD-10-CM | POA: Diagnosis present

## 2021-09-25 LAB — POCT I-STAT CREATININE: Creatinine, Ser: 1 mg/dL (ref 0.61–1.24)

## 2021-09-25 MED ORDER — NITROGLYCERIN 0.4 MG SL SUBL
0.8000 mg | SUBLINGUAL_TABLET | Freq: Once | SUBLINGUAL | Status: AC
  Administered 2021-09-25: 0.8 mg via SUBLINGUAL

## 2021-09-25 MED ORDER — IOHEXOL 350 MG/ML SOLN
100.0000 mL | Freq: Once | INTRAVENOUS | Status: AC | PRN
  Administered 2021-09-25: 100 mL via INTRAVENOUS

## 2021-09-25 NOTE — Progress Notes (Signed)
Patient tolerated procedure well. Ambulate w/o difficulty. Denies light headedness or being dizzy. Sitting in chair drinking water provided. Encouraged to drink extra water today and reasoning explained. Verbalized understanding. All questions answered. ABC intact. No further needs. Discharge from procedure area w/o issues.   °

## 2021-09-26 DIAGNOSIS — R931 Abnormal findings on diagnostic imaging of heart and coronary circulation: Secondary | ICD-10-CM

## 2021-10-23 ENCOUNTER — Other Ambulatory Visit: Payer: Self-pay | Admitting: Student

## 2021-10-23 DIAGNOSIS — R55 Syncope and collapse: Secondary | ICD-10-CM

## 2021-11-05 ENCOUNTER — Other Ambulatory Visit: Payer: Self-pay | Admitting: Student

## 2021-11-05 DIAGNOSIS — I7774 Dissection of vertebral artery: Secondary | ICD-10-CM

## 2021-11-05 DIAGNOSIS — I729 Aneurysm of unspecified site: Secondary | ICD-10-CM

## 2021-11-05 DIAGNOSIS — I671 Cerebral aneurysm, nonruptured: Secondary | ICD-10-CM

## 2021-11-09 ENCOUNTER — Ambulatory Visit
Admission: RE | Admit: 2021-11-09 | Discharge: 2021-11-09 | Disposition: A | Discharge status: Home / Self Care | Payer: Medicare Other | Source: Ambulatory Visit | Attending: Student | Admitting: Student

## 2021-11-09 DIAGNOSIS — I671 Cerebral aneurysm, nonruptured: Secondary | ICD-10-CM | POA: Diagnosis present

## 2022-11-27 IMAGING — CT CT HEART MORP W/ CTA COR W/ SCORE W/ CA W/CM &/OR W/O CM
1 of 17 series · 3 of 20 positions shown, 4 images · non-contrast
Comparison: None.

Addendum:
CLINICAL DATA: Syncope, abnormal stress test

EXAM:
Cardiac/Coronary  CTA
TECHNIQUE: The patient was scanned on a Siemens Somatom go.Top scanner.

[Series 4: multiphase % cta coronary 0.60 · axial · 0.42mm/px · z∈[-1158,-1038]mm · 3 of 3612 slices shown, 4 images]
[im 1/3612  vessel]
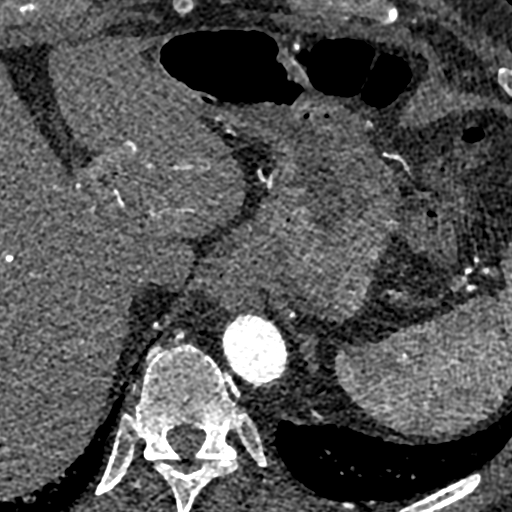
[im 1/3612  lung]
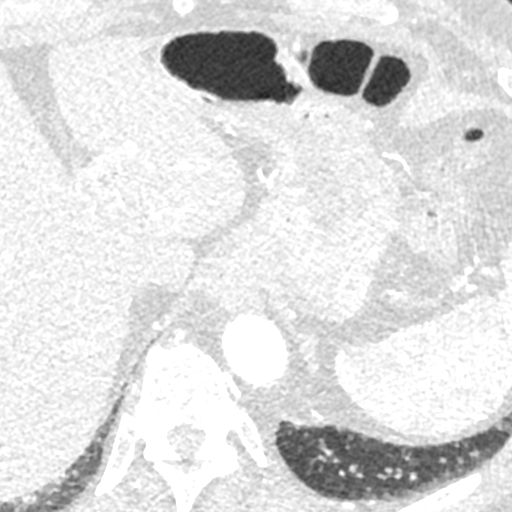
[im 1806/3612  vessel]
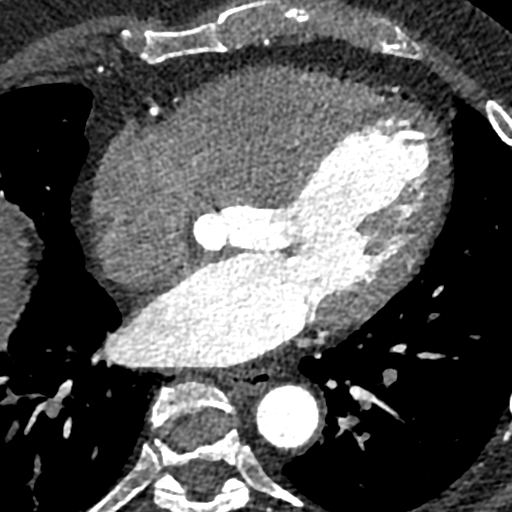
[im 3612/3612  vessel]
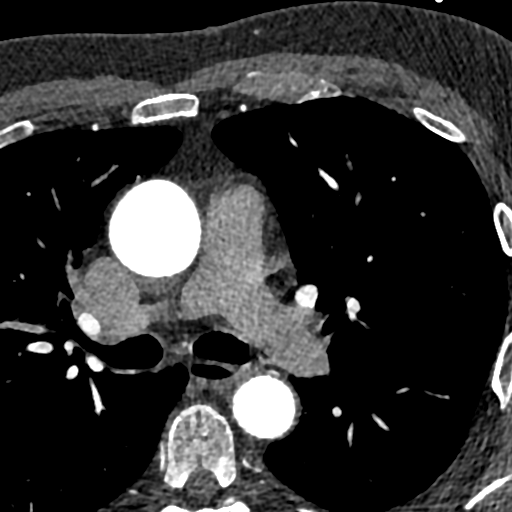

[3 of 20 positions shown; findings below may reference images not displayed]

:
A retrospective scan was triggered in the descending thoracic aorta.
Axial non-contrast 3 mm slices were carried out through the heart.
The data set was analyzed on a dedicated work station and scored
using the Agatson method. Gantry rotation speed was 330 msecs and
collimation was .6 mm. 100mg of metoprolol and 0.8 mg of sl NTG was
given. The 3D data set was reconstructed in 5% intervals of the
60-95 % of the R-R cycle. Diastolic phases were analyzed on a
dedicated work station using MPR, MIP and VRT modes. The patient
received 100 cc of contrast.
FINDINGS: Aorta: Normal size. Mild aortic root and descending aorta
calcifications. No dissection.

Aortic Valve:  Trileaflet.  No calcifications.

Coronary Arteries:  Normal coronary origin.  Right dominance.

RCA is a dominant artery that gives rise to PDA and PLA. There is no
plaque.

Left main is a large artery that gives rise to LAD and LCX arteries.
LM has no disease.

LAD has calcified plaque in the proximal to mid segment causing
moderate stenosis (50%).

LCX is a non-dominant artery that gives rise to two obtuse marginal
branches. There is no plaque.

Other findings:

Normal pulmonary vein drainage into the left atrium.

Normal left atrial appendage without a thrombus.

Normal size of the pulmonary artery.
IMPRESSION: 1. Coronary calcium score of 356. This was 65th percentile for age
and sex matched control.

2. Normal coronary origin with right dominance.

3. Calcified plaque in the proximal to mid LAD causing moderate
stenosis (50%).

4. CAD-RADS 3. Moderate stenosis. Consider symptom-guided
anti-ischemic pharmacotherapy as well as risk factor modification
per guideline directed care.

5. Additional analysis with CT FFR will be submitted and reported
separately.

EXAM:
OVER-READ INTERPRETATION  CT CHEST

The following report is an over-read performed by radiologist Dr.
over-read does not include interpretation of cardiac or coronary
anatomy or pathology. The interpretation by the cardiologist is
attached.
FINDINGS: Minimal scattered volume loss in the lungs.  No acute findings.
IMPRESSION: No acute extracardiac findings.

*** End of Addendum ***
:
A retrospective scan was triggered in the descending thoracic aorta.
Axial non-contrast 3 mm slices were carried out through the heart.
The data set was analyzed on a dedicated work station and scored
using the Agatson method. Gantry rotation speed was 330 msecs and
collimation was .6 mm. 100mg of metoprolol and 0.8 mg of sl NTG was
given. The 3D data set was reconstructed in 5% intervals of the
60-95 % of the R-R cycle. Diastolic phases were analyzed on a
dedicated work station using MPR, MIP and VRT modes. The patient
received 100 cc of contrast.
FINDINGS: Aorta: Normal size. Mild aortic root and descending aorta
calcifications. No dissection.

Aortic Valve:  Trileaflet.  No calcifications.

Coronary Arteries:  Normal coronary origin.  Right dominance.

RCA is a dominant artery that gives rise to PDA and PLA. There is no
plaque.

Left main is a large artery that gives rise to LAD and LCX arteries.
LM has no disease.

LAD has calcified plaque in the proximal to mid segment causing
moderate stenosis (50%).

LCX is a non-dominant artery that gives rise to two obtuse marginal
branches. There is no plaque.

Other findings:

Normal pulmonary vein drainage into the left atrium.

Normal left atrial appendage without a thrombus.

Normal size of the pulmonary artery.
IMPRESSION: 1. Coronary calcium score of 356. This was 65th percentile for age
and sex matched control.

2. Normal coronary origin with right dominance.

3. Calcified plaque in the proximal to mid LAD causing moderate
stenosis (50%).

4. CAD-RADS 3. Moderate stenosis. Consider symptom-guided
anti-ischemic pharmacotherapy as well as risk factor modification
per guideline directed care.

5. Additional analysis with CT FFR will be submitted and reported
separately.

## 2022-12-19 ENCOUNTER — Emergency Department: Payer: Medicare Other

## 2022-12-19 ENCOUNTER — Other Ambulatory Visit: Payer: Self-pay

## 2022-12-19 ENCOUNTER — Emergency Department
Admission: EM | Admit: 2022-12-19 | Discharge: 2022-12-19 | Disposition: A | Discharge status: Home / Self Care | Payer: Medicare Other | Attending: Emergency Medicine | Admitting: Emergency Medicine

## 2022-12-19 DIAGNOSIS — R0789 Other chest pain: Secondary | ICD-10-CM | POA: Diagnosis not present

## 2022-12-19 DIAGNOSIS — I10 Essential (primary) hypertension: Secondary | ICD-10-CM | POA: Insufficient documentation

## 2022-12-19 DIAGNOSIS — R079 Chest pain, unspecified: Secondary | ICD-10-CM

## 2022-12-19 LAB — CBC
HCT: 46.7 % (ref 39.0–52.0)
Hemoglobin: 15.6 g/dL (ref 13.0–17.0)
MCH: 30.7 pg (ref 26.0–34.0)
MCHC: 33.4 g/dL (ref 30.0–36.0)
MCV: 91.9 fL (ref 80.0–100.0)
Platelets: 236 10*3/uL (ref 150–400)
RBC: 5.08 MIL/uL (ref 4.22–5.81)
RDW: 12.2 % (ref 11.5–15.5)
WBC: 6.9 10*3/uL (ref 4.0–10.5)
nRBC: 0 % (ref 0.0–0.2)

## 2022-12-19 LAB — BASIC METABOLIC PANEL
Anion gap: 10 (ref 5–15)
BUN: 20 mg/dL (ref 8–23)
CO2: 25 mmol/L (ref 22–32)
Calcium: 9.4 mg/dL (ref 8.9–10.3)
Chloride: 103 mmol/L (ref 98–111)
Creatinine, Ser: 1.08 mg/dL (ref 0.61–1.24)
GFR, Estimated: >60 mL/min (ref >60)
Glucose, Bld: 91 mg/dL (ref 70–99)
Potassium: 4 mmol/L (ref 3.5–5.1)
Sodium: 138 mmol/L (ref 135–145)

## 2022-12-19 LAB — TROPONIN I (HIGH SENSITIVITY)
Troponin I (High Sensitivity): 4 ng/L (ref <18)
Troponin I (High Sensitivity): 4 ng/L (ref <18)

## 2022-12-19 NOTE — ED Provider Notes (Signed)
[facility] Provider Note    Event Date/Time   First MD Initiated Contact with Patient 12/19/22 1517     (approximate)   History   Chief Complaint Chest Pain   HPI  Terry Suarez. is a 72 y.o. male with past medical history of hypertension, hyperlipidemia, and PVCs who presents to the ED complaining of chest pain.  Patient reports that since yesterday he has been dealing with intermittent episodes of dull aching pain in the left side of his chest.  He states that the pain can come on at any time, does not seem to be associated with exertion.  He had another episode of pain this afternoon while driving, states it lasted for about 30 minutes before resolving.  He denies any associated difficulty breathing, has not had any pain or swelling in his legs.  He denies any recent fevers or cough, has not had any nausea or vomiting.     Physical Exam   Triage Vital Signs: ED Triage Vitals  Encounter Vitals Group     BP 12/19/22 1425 (!) 163/88     Systolic BP Percentile --      Diastolic BP Percentile --      Pulse Rate 12/19/22 1425 69     Resp 12/19/22 1425 16     Temp 12/19/22 1425 98 F (36.7 C)     Temp Source 12/19/22 1425 Oral     SpO2 12/19/22 1425 96 %     Weight --      Height --      Head Circumference --      Peak Flow --      Pain Score 12/19/22 1426 1     Pain Loc --      Pain Education --      Exclude from Growth Chart --     Most recent vital signs: Vitals:   12/19/22 1425 12/19/22 1504  BP: (!) 163/88 (!) 163/93  Pulse: 69 68  Resp: 16 19  Temp: 98 F (36.7 C)   SpO2: 96% 95%    Constitutional: Alert and oriented. Eyes: Conjunctivae are normal. Head: Atraumatic. Nose: No congestion/rhinnorhea. Mouth/Throat: Mucous membranes are moist.  Cardiovascular: Normal rate, regular rhythm. Grossly normal heart sounds.  2+ radial pulses bilaterally. Respiratory: Normal respiratory effort.  No retractions. Lungs  CTAB. Gastrointestinal: Soft and nontender. No distention. Musculoskeletal: No lower extremity tenderness nor edema.  Neurologic:  Normal speech and language. No gross focal neurologic deficits are appreciated.    ED Results / Procedures / Treatments   Labs (all labs ordered are listed, but only abnormal results are displayed) Labs Reviewed  BASIC METABOLIC PANEL  CBC  TROPONIN I (HIGH SENSITIVITY)  TROPONIN I (HIGH SENSITIVITY)     EKG  ED ECG REPORT I, Chesley Noon, the attending physician, personally viewed and interpreted this ECG.   Date: 12/19/2022  EKG Time: 14:20  Rate: 72  Rhythm: normal sinus rhythm  Axis: Normal  Intervals:none  ST&T Change: None  RADIOLOGY Chest x-ray reviewed and interpreted by me with no infiltrate, edema, or effusion.  PROCEDURES:  Critical Care performed: No  Procedures   MEDICATIONS ORDERED IN ED: Medications - No data to display   IMPRESSION / MDM / ASSESSMENT AND PLAN / ED COURSE  I reviewed the triage vital signs and the nursing notes.  72 y.o. male with past medical history of hypertension, hyperlipidemia, and PVCs who presents to the ED complaining of intermittent episodes of chest pain since yesterday afternoon.  Patient's presentation is most consistent with acute presentation with potential threat to life or bodily function.  Differential diagnosis includes, but is not limited to, ACS, PE, dissection, pneumonia, pneumothorax, musculoskeletal pain, GERD, anxiety.  Patient nontoxic-appearing and in no acute distress, vital signs are unremarkable.  EKG shows no evidence of arrhythmia or ischemia and patient reports his pain has resolved.  Initial troponin within normal limits but given acute onset we will check second set troponin.  Additional labs are reassuring with no significant anemia, leukocytosis, tract abnormality, or AKI.  Chest x-ray is unremarkable.  Repeat troponin within normal  limits and patient appropriate for discharge home with outpatient cardiology follow-up.  He was counseled to return to the ED for new or worsening symptoms.  Patient agrees with plan.      FINAL CLINICAL IMPRESSION(S) / ED DIAGNOSES   Final diagnoses:  Nonspecific chest pain     Rx / DC Orders   ED Discharge Orders          Ordered    Ambulatory referral to Cardiology        12/19/22 1733             Note:  This document was prepared using Dragon voice recognition software and may include unintentional dictation errors.   Chesley Noon, MD 12/19/22 [phone]

## 2022-12-19 NOTE — ED Triage Notes (Signed)
Pt comes with c/o cp. Pt was driving when this started today. Pt states left chest pain and some radiation to neck and arm.   Pt states little pain in chest yesterday.
